# Patient Record
Sex: Female | Born: 1955 | Race: Black or African American | Hispanic: No | Marital: Married | Smoking: Former smoker
Health system: Southern US, Community
[De-identification: ages and names within clinical notes are randomized; demographics above are authoritative.]

## PROBLEM LIST (undated history)

## (undated) DIAGNOSIS — G4730 Sleep apnea, unspecified: Secondary | ICD-10-CM

## (undated) DIAGNOSIS — E119 Type 2 diabetes mellitus without complications: Secondary | ICD-10-CM

## (undated) HISTORY — PX: UTERINE FIBROID SURGERY: SHX826

---

## 2010-03-19 HISTORY — PX: PARTIAL HYSTERECTOMY: SHX80

## 2018-12-03 ENCOUNTER — Other Ambulatory Visit: Payer: Self-pay

## 2018-12-03 ENCOUNTER — Ambulatory Visit
Admission: EM | Admit: 2018-12-03 | Discharge: 2018-12-03 | Disposition: A | Discharge status: Home / Self Care | Payer: 59 | Attending: Physician Assistant | Admitting: Physician Assistant

## 2018-12-03 DIAGNOSIS — H6990 Unspecified Eustachian tube disorder, unspecified ear: Secondary | ICD-10-CM | POA: Diagnosis not present

## 2018-12-03 DIAGNOSIS — G8929 Other chronic pain: Secondary | ICD-10-CM

## 2018-12-03 DIAGNOSIS — S0300XA Dislocation of jaw, unspecified side, initial encounter: Secondary | ICD-10-CM

## 2018-12-03 DIAGNOSIS — H9203 Otalgia, bilateral: Secondary | ICD-10-CM | POA: Diagnosis not present

## 2018-12-03 HISTORY — DX: Type 2 diabetes mellitus without complications: E11.9

## 2018-12-03 MED ORDER — AZELASTINE HCL 0.1 % NA SOLN: 2.0000 | Freq: Two times a day (BID) | NASAL | 0 refills | Status: DC

## 2018-12-03 MED ORDER — MELOXICAM 7.5 MG PO TABS: 7.5000 mg | ORAL_TABLET | Freq: Every day | ORAL | 0 refills | Status: DC

## 2018-12-03 NOTE — Discharge Instructions (Addendum)
You can start over the counter flonase/nasacort nasal spray for ear pain for 1-2 weeks. If not improving, can fill azelastine for possible eustachian tube dysfunction. Start Mobic. Do not take ibuprofen (motrin/advil)/ naproxen (aleve) while on mobic. This will help decrease inflammation for the jaw to see if this is the cause of ear pain. Follow up with ENT if symptoms not improving.

## 2018-12-03 NOTE — ED Triage Notes (Signed)
Pt presents to UC w/ c/o bilateral ear pain x2-3 months. Pt denies drainage. Pt reports being seen last year December for the same thing and have done MRIs. Pt reports taking tylenol 1000mg  that helps with pain but it keeps coming back.

## 2018-12-03 NOTE — ED Provider Notes (Signed)
[name]    CSN: [id number] Arrival date & time: 12/03/18  1159      History   Chief Complaint Chief Complaint  Patient presents with  . bilateral ear pain    HPI Michaela GildingLinda Alvarado is a 63 y.o. female.   63 year old female comes in with 2-3 month history of bilateral ear pain. States she has history of ear pain, and was scheduled to have MRI done. However, she [place] prior to this. She would like to make sure she didn't have an infection and therefore came in for evaluation.  Pain is intermittent, bilateral, and can have pain to the neck, head due to ear pain.  Pain is usually worse first thing in the morning, that relieved throughout the day.  Tylenol with some improvement.  She denies muffled hearing, ear drainage.  Denies URI symptoms such as cough, congestion, sore throat.  Denies fever, chills, night sweats.     Past Medical History:  Diagnosis Date  . Diabetes mellitus without complication (HCC)     There are no active problems to display for this patient.   Past Surgical History:  Procedure Laterality Date  . UTERINE FIBROID SURGERY      OB History   No obstetric history on file.      Home Medications    Prior to Admission medications   Medication Sig Start Date End Date Taking? Authorizing Provider  azelastine (ASTELIN) 0.1 % nasal spray Place 2 sprays into both nostrils 2 (two) times daily. Use in each nostril as directed 12/03/18   Cathie HoopsYu, Amy V, PA-C  meloxicam (MOBIC) 7.5 MG tablet Take 1 tablet (7.5 mg total) by mouth daily. 12/03/18   Belinda FisherYu, Amy V, PA-C    Family History Family History  Problem Relation Age of Onset  . Diabetes Mother   . Hypertension Mother   . Leukemia Father     Social History Social History   Tobacco Use  . Smoking status: Former Games developermoker  . Smokeless tobacco: Never Used  Substance Use Topics  . Alcohol use: Never    Frequency: Never  . Drug use: Never     Allergies   Flagyl  [metronidazole] and Peach [prunus persica]   Review of Systems Review of Systems  Reason unable to perform ROS: See HPI as above.     Physical Exam Triage Vital Signs ED Triage Vitals  Enc Vitals Group     BP 12/03/18 1215 (!) 156/80     Pulse Rate 12/03/18 1215 81     Resp 12/03/18 1215 16     Temp 12/03/18 1215 98.2 F (36.8 C)     Temp Source 12/03/18 1215 Oral     SpO2 12/03/18 1215 96 %     Weight --      Height --      Head Circumference --      Peak Flow --      Pain Score 12/03/18 1220 6     Pain Loc --      Pain Edu? --      Excl. in [place]? --    No data found.  Updated Vital Signs BP (!) 156/80 (BP Location: Left Arm)   Pulse 81   Temp 98.2 F (36.8 C) (Oral)   Resp 16   SpO2 96%   Physical Exam Constitutional:      General: She is not in acute distress.    Appearance: Normal appearance. She is not ill-appearing,  toxic-appearing or diaphoretic.  HENT:     Head: Normocephalic and atraumatic.     Comments: Tenderness to TMJ with crepitus. No swelling, trismus.     Ears:     Comments: No tenderness to palpation of tragus and mastoid. No swelling of the ear canal. TM visible, no erythema, bulging, mid ear effusion.     Mouth/Throat:     Mouth: Mucous membranes are moist.     Pharynx: Oropharynx is clear. Uvula midline.  Neck:     Musculoskeletal: Normal range of motion and neck supple. No spinous process tenderness or muscular tenderness.  Cardiovascular:     Rate and Rhythm: Normal rate and regular rhythm.     Heart sounds: Normal heart sounds. No murmur. No friction rub. No gallop.   Pulmonary:     Effort: Pulmonary effort is normal. No accessory muscle usage, prolonged expiration, respiratory distress or retractions.     Comments: Lungs clear to auscultation without adventitious lung sounds. Neurological:     General: No focal deficit present.     Mental Status: She is alert and oriented to person, place, and time.      UC Treatments / Results   Labs (all labs ordered are listed, but only abnormal results are displayed) Labs Reviewed - No data to display  EKG   Radiology No results found.  Procedures Procedures (including critical care time)  Medications Ordered in UC Medications - No data to display  Initial Impression / Assessment and Plan / UC Course  I have reviewed the triage vital signs and the nursing notes.  Pertinent labs & imaging results that were available during my care of the patient were reviewed by me and considered in my medical decision making (see chart for details).    ? Possible TMD causing ear/neck pain. Start mobic as directed. Will have patient start flonase/nasacort to cover for eustachian tube dysfunction. Can switch to azelastine if needed. Patient to follow up with ENT for further evaluation if symptoms not improving.   Final Clinical Impressions(s) / UC Diagnoses   Final diagnoses:  Chronic ear pain, bilateral  TMJ (dislocation of temporomandibular joint), initial encounter   ED Prescriptions    Medication Sig Dispense Auth. Provider   azelastine (ASTELIN) 0.1 % nasal spray Place 2 sprays into both nostrils 2 (two) times daily. Use in each nostril as directed 30 mL Yu, Amy V, PA-C   meloxicam (MOBIC) 7.5 MG tablet Take 1 tablet (7.5 mg total) by mouth daily. 15 tablet Tobin Chad, [place] 12/03/18 1445

## 2018-12-04 ENCOUNTER — Telehealth: Payer: Self-pay

## 2019-01-23 ENCOUNTER — Other Ambulatory Visit: Payer: Self-pay | Admitting: Otolaryngology

## 2019-01-23 DIAGNOSIS — M899 Disorder of bone, unspecified: Secondary | ICD-10-CM

## 2019-01-23 DIAGNOSIS — H9203 Otalgia, bilateral: Secondary | ICD-10-CM

## 2019-01-23 DIAGNOSIS — E079 Disorder of thyroid, unspecified: Secondary | ICD-10-CM

## 2019-01-23 DIAGNOSIS — H918X1 Other specified hearing loss, right ear: Secondary | ICD-10-CM

## 2019-01-23 DIAGNOSIS — IMO0001 Reserved for inherently not codable concepts without codable children: Secondary | ICD-10-CM

## 2019-01-30 ENCOUNTER — Ambulatory Visit
Admission: RE | Admit: 2019-01-30 | Discharge: 2019-01-30 | Disposition: A | Discharge status: Home / Self Care | Payer: 59 | Source: Ambulatory Visit | Attending: Otolaryngology | Admitting: Otolaryngology

## 2019-01-30 DIAGNOSIS — E079 Disorder of thyroid, unspecified: Secondary | ICD-10-CM

## 2019-02-05 ENCOUNTER — Other Ambulatory Visit: Payer: Self-pay | Admitting: Otolaryngology

## 2019-02-05 DIAGNOSIS — E041 Nontoxic single thyroid nodule: Secondary | ICD-10-CM

## 2019-02-15 ENCOUNTER — Other Ambulatory Visit: Payer: Self-pay

## 2019-02-15 ENCOUNTER — Ambulatory Visit
Admission: RE | Admit: 2019-02-15 | Discharge: 2019-02-15 | Disposition: A | Discharge status: Home / Self Care | Payer: 59 | Source: Ambulatory Visit | Attending: Otolaryngology | Admitting: Otolaryngology

## 2019-02-15 DIAGNOSIS — M899 Disorder of bone, unspecified: Secondary | ICD-10-CM

## 2019-02-15 DIAGNOSIS — H918X1 Other specified hearing loss, right ear: Secondary | ICD-10-CM

## 2019-02-15 DIAGNOSIS — H9203 Otalgia, bilateral: Secondary | ICD-10-CM

## 2019-02-15 DIAGNOSIS — IMO0001 Reserved for inherently not codable concepts without codable children: Secondary | ICD-10-CM

## 2019-02-15 MED ORDER — GADOBENATE DIMEGLUMINE 529 MG/ML IV SOLN
20.0000 mL | Freq: Once | INTRAVENOUS | Status: AC | PRN
  Administered 2019-02-15: 20 mL via INTRAVENOUS

## 2019-02-25 ENCOUNTER — Inpatient Hospital Stay (HOSPITAL_COMMUNITY): Admit: 2019-02-25 | Payer: 59

## 2019-02-25 ENCOUNTER — Ambulatory Visit
Admission: RE | Admit: 2019-02-25 | Discharge: 2019-02-25 | Disposition: A | Discharge status: Home / Self Care | Payer: 59 | Source: Ambulatory Visit | Attending: Otolaryngology | Admitting: Otolaryngology

## 2019-02-25 ENCOUNTER — Other Ambulatory Visit (HOSPITAL_COMMUNITY)
Admission: RE | Admit: 2019-02-25 | Discharge: 2019-02-25 | Disposition: A | Discharge status: Home / Self Care | Payer: 59 | Source: Ambulatory Visit | Attending: Otolaryngology | Admitting: Otolaryngology

## 2019-02-25 DIAGNOSIS — E041 Nontoxic single thyroid nodule: Secondary | ICD-10-CM | POA: Insufficient documentation

## 2019-02-25 NOTE — Procedures (Signed)
PROCEDURE SUMMARY:  Using direct ultrasound guidance, 5 passes were made using 25 g needles into the nodule within the right inferior lobe of the thyroid and  5 passes were made using 25 g needles into the nodule within the left inferior lobe of the thyroid.   Ultrasound was used to confirm needle placements on all occasions.   EBL = trace  Specimens were sent to Pathology for analysis.  See procedure note under Imaging tab in Epic for full procedure details.  Other Atienza S Jonnie Truxillo PA-C 02/25/2019 4:08 PM

## 2019-02-26 LAB — CYTOLOGY - NON PAP

## 2019-07-22 ENCOUNTER — Ambulatory Visit
Admission: EM | Admit: 2019-07-22 | Discharge: 2019-07-22 | Disposition: A | Discharge status: Home / Self Care | Payer: 59 | Attending: Physician Assistant | Admitting: Physician Assistant

## 2019-07-22 DIAGNOSIS — R002 Palpitations: Secondary | ICD-10-CM

## 2019-07-22 DIAGNOSIS — K219 Gastro-esophageal reflux disease without esophagitis: Secondary | ICD-10-CM

## 2019-07-22 LAB — POCT FASTING CBG KUC MANUAL ENTRY: POCT Glucose (KUC): 108 mg/dL — AB (ref 70–99)

## 2019-07-22 NOTE — ED Triage Notes (Signed)
Pt states dx with pre diabetes with no meds last year with an A1C 6.1. states hasn't felt good at night for the past few weeks having frequent urination and pain to lt foot. States also has neck pain and palpitations at night. Pt denies any problems at this time.

## 2019-07-22 NOTE — ED Provider Notes (Signed)
[name]    CSN: [id number] Arrival date & time: 07/22/19  1009      History   Chief Complaint Chief Complaint  Patient presents with  . Hyperglycemia    HPI Michaela Alvarado is a 64 y.o. female.   64 year old female comes in for worries of hyperglycemia.  Patient states has a history of prediabetes, and had A1c of 6.1 last year.  Her current PCP appointment is scheduled for August 2021.  Recently, started feeling neck pain and palpitations at night, and worries for uncontrolled diabetes. States has GERD at baseline, unknown if worsening. Denies palpitations during the day, chest pain, shortness of breath. Denies weakness, dizziness, syncope. Denies exertional fatigue, exertional chest pain, dyspnea on exertion. History of thyroid nodule without abnormal TSH. She is currently asymptomatic.      Past Medical History:  Diagnosis Date  . Diabetes mellitus without complication ([place])     There are no problems to display for this patient.   Past Surgical History:  Procedure Laterality Date  . UTERINE FIBROID SURGERY      OB History   No obstetric history on file.      Home Medications    Prior to Admission medications   Not on File    Family History Family History  Problem Relation Age of Onset  . Diabetes Mother   . Hypertension Mother   . Leukemia Father     Social History Social History   Tobacco Use  . Smoking status: Former Research scientist (life sciences)  . Smokeless tobacco: Never Used  Substance Use Topics  . Alcohol use: Never  . Drug use: Never     Allergies   Flagyl [metronidazole] and Peach [prunus persica]   Review of Systems Review of Systems  Reason unable to perform ROS: See HPI as above.     Physical Exam Triage Vital Signs ED Triage Vitals  Enc Vitals Group     BP 07/22/19 1017 134/81     Pulse Rate 07/22/19 1017 87     Resp 07/22/19 1017 18     Temp 07/22/19 1017 98.3 F (36.8 C)     Temp Source 07/22/19 1017 Oral   SpO2 07/22/19 1017 95 %     Weight --      Height --      Head Circumference --      Peak Flow --      Pain Score 07/22/19 1025 5     Pain Loc --      Pain Edu? --      Excl. in [place]? --    No data found.  Updated Vital Signs BP 134/81 (BP Location: Right Arm)   Pulse 87   Temp 98.3 F (36.8 C) (Oral)   Resp 18   SpO2 95%   Physical Exam Constitutional:      General: She is not in acute distress.    Appearance: Normal appearance. She is well-developed. She is not toxic-appearing or diaphoretic.  HENT:     Head: Normocephalic and atraumatic.  Eyes:     Conjunctiva/sclera: Conjunctivae normal.     Pupils: Pupils are equal, round, and reactive to light.  Cardiovascular:     Rate and Rhythm: Normal rate and regular rhythm.  Pulmonary:     Effort: Pulmonary effort is normal. No respiratory distress.     Comments: Speaking in full sentences without difficulty. LCTAB Musculoskeletal:     Cervical back: Normal range of motion and neck supple.  Skin:  General: Skin is warm and dry.  Neurological:     Mental Status: She is alert and oriented to person, place, and time.      UC Treatments / Results  Labs (all labs ordered are listed, but only abnormal results are displayed) Labs Reviewed  POCT FASTING CBG [facility] MANUAL ENTRY - Abnormal; Notable for the following components:      Result Value   POCT Glucose ([facility]) 108 (*)    All other components within normal limits    EKG   Radiology No results found.  Procedures Procedures (including critical care time)  Medications Ordered in UC Medications - No data to display  Initial Impression / Assessment and Plan / UC Course  I have reviewed the triage vital signs and the nursing notes.  Pertinent labs & imaging results that were available during my care of the patient were reviewed by me and considered in my medical decision making (see chart for details).    Fasting CBG 108. EKG NSR, 74bpm, no acute ST changes, no  prior EKG for comparison.  Patient asymptomatic at this time. No exertional symptoms, low suspicion for ACS. Can try otc H2 blocker/PPI for GERD. Otherwise, to follow up with PCP as scheduled for further evaluation. Return precautions given. Patient expresses understanding and agrees to plan.  Final Clinical Impressions(s) / UC Diagnoses   Final diagnoses:  Palpitations  Gastroesophageal reflux disease without esophagitis   ED Prescriptions    None     PDMP not reviewed this encounter.   Belinda Fisher, PA-C 07/22/19 1257

## 2019-07-22 NOTE — Discharge Instructions (Addendum)
No alarming signs on exam. You glucose was 108 today. You can try over the counter pepcid 20mg  daily for the next 7-14 days to see if acid reflux is causing your symptoms. Keep hydrated, urine should be clear to pale yellow in color. Watch your carbohydrate intake. Otherwise, follow up with PCP as scheduled for further evaluation and management needed. If having chest pain, shortness of breath, passing out, go to the ED for further evaluation.   BP 134/81, temp 98.3, HR 87, RR 18, O2 95%

## 2019-12-03 ENCOUNTER — Other Ambulatory Visit: Payer: Self-pay | Admitting: Family Medicine

## 2019-12-03 DIAGNOSIS — Z1231 Encounter for screening mammogram for malignant neoplasm of breast: Secondary | ICD-10-CM

## 2019-12-07 ENCOUNTER — Ambulatory Visit: Payer: 59

## 2020-01-01 ENCOUNTER — Ambulatory Visit
Admission: RE | Admit: 2020-01-01 | Discharge: 2020-01-01 | Disposition: A | Discharge status: Home / Self Care | Payer: 59 | Source: Ambulatory Visit | Attending: Family Medicine | Admitting: Family Medicine

## 2020-01-01 ENCOUNTER — Other Ambulatory Visit: Payer: Self-pay

## 2020-01-01 DIAGNOSIS — Z1231 Encounter for screening mammogram for malignant neoplasm of breast: Secondary | ICD-10-CM

## 2020-05-25 ENCOUNTER — Other Ambulatory Visit: Payer: Self-pay

## 2020-05-25 ENCOUNTER — Ambulatory Visit
Admission: EM | Admit: 2020-05-25 | Discharge: 2020-05-25 | Disposition: A | Discharge status: Home / Self Care | Payer: 59 | Attending: Emergency Medicine | Admitting: Emergency Medicine

## 2020-05-25 DIAGNOSIS — R42 Dizziness and giddiness: Secondary | ICD-10-CM | POA: Diagnosis not present

## 2020-05-25 DIAGNOSIS — R519 Headache, unspecified: Secondary | ICD-10-CM

## 2020-05-25 LAB — POCT FASTING CBG KUC MANUAL ENTRY: POCT Glucose (KUC): 109 mg/dL — AB (ref 70–99)

## 2020-05-25 MED ORDER — ONDANSETRON 4 MG PO TBDP: 4.0000 mg | ORAL_TABLET | Freq: Three times a day (TID) | ORAL | 0 refills | Status: DC | PRN

## 2020-05-25 MED ORDER — NAPROXEN 500 MG PO TABS: 500.0000 mg | ORAL_TABLET | Freq: Two times a day (BID) | ORAL | 0 refills | Status: DC

## 2020-05-25 MED ORDER — TIZANIDINE HCL 4 MG PO TABS: 4.0000 mg | ORAL_TABLET | Freq: Four times a day (QID) | ORAL | 0 refills | Status: DC | PRN

## 2020-05-25 MED ORDER — KETOROLAC TROMETHAMINE 30 MG/ML IJ SOLN
30.0000 mg | Freq: Once | INTRAMUSCULAR | Status: AC
  Administered 2020-05-25: 30 mg via INTRAMUSCULAR

## 2020-05-25 MED ORDER — ONDANSETRON 4 MG PO TBDP
4.0000 mg | ORAL_TABLET | Freq: Once | ORAL | Status: AC
  Administered 2020-05-25: 4 mg via ORAL

## 2020-05-25 NOTE — ED Triage Notes (Signed)
Pt c/o lightheadedness and headache x3 days. States hx of headaches and has an appt with neurologist on 04/05.

## 2020-05-25 NOTE — Discharge Instructions (Addendum)
Blood sugar today- 109 We gave you a shot of Toradol Continue with Naprosyn twice daily at home as needed for headaches with food Zofran dissolved in mouth for nausea Supplement tizanidine at home/bedtime for neck pain, this is a muscle relaxer, may cause drowsiness, start with half tablet increase to 1 tablet if tolerating  Follow-up with neurology as planned If headache/lightheadedness progressing or worsening please go to the emergency room

## 2020-05-25 NOTE — ED Provider Notes (Signed)
[name]    CSN: [id number] Arrival date & time: 05/25/20  4270      History   Chief Complaint Chief Complaint  Patient presents with  . Dizziness  . Headache    HPI Michaela Alvarado is a 65 y.o. female history of DM type II presenting today for evaluation of lightheadedness and headache.  Patient reports that she has history of headaches and has plans to follow-up with neurology in approximately 1 month.  Reports that she has had intermittent headaches for approximately 5 years.  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  Had neurology visit approximately 1 year ago and was believed that this area was unlikely malignancy, but recommended to follow-up.  She has plans to see neurology in approximately 1 month.  Over the past 3 days she has continued to have headaches as well as associated lightheadedness which she describes as slightly feeling off balance.  Sensations are intermittent and will come in waves.  Notices symptoms worse with lying flat.  Denies spinning sensation.  Denies recent URI symptoms, cough congestion or sore throat.  Denies fevers.  Does report some mild neck soreness and discomfort, more prominent on the right side.  She denies any chest pain or shortness of breath.  Denies sensations of presyncope.  Typically will use Tylenol or Excedrin for headaches.  Denies any weakness.  HPI  Past Medical History:  Diagnosis Date  . Diabetes mellitus without complication (HCC)     There are no problems to display for this patient.   Past Surgical History:  Procedure Laterality Date  . UTERINE FIBROID SURGERY      OB History   No obstetric history on file.      Home Medications    Prior to Admission medications   Medication Sig Start Date End Date Taking? Authorizing Provider  naproxen (NAPROSYN) 500 MG tablet Take 1  tablet (500 mg total) by mouth 2 (two) times daily. 05/25/20  Yes Dorene Bruni C, PA-C  ondansetron (ZOFRAN ODT) 4 MG disintegrating tablet Take 1 tablet (4 mg total) by mouth every 8 (eight) hours as needed for nausea or vomiting. 05/25/20  Yes Shloma Roggenkamp C, PA-C  tiZANidine (ZANAFLEX) 4 MG tablet Take 1 tablet (4 mg total) by mouth every 6 (six) hours as needed for muscle spasms. 05/25/20  Yes Kesean Serviss, Junius Creamer, PA-C    Family History Family History  Problem Relation Age of Onset  . Diabetes Mother   . Hypertension Mother   . Leukemia Father     Social History Social History   Tobacco Use  . Smoking status: Former Games developer  . Smokeless tobacco: Never Used  Substance Use Topics  . Alcohol use: Never  . Drug use: Never     Allergies   Flagyl [metronidazole] and Peach [prunus persica]   Review of Systems Review of Systems  Constitutional: Negative for fatigue and fever.  HENT: Negative for congestion, sinus pressure and sore throat.   Eyes: Negative for photophobia, pain and visual disturbance.  Respiratory: Negative for cough and shortness of breath.   Cardiovascular: Negative for chest pain.  Gastrointestinal: Negative for abdominal pain, nausea and vomiting.  Genitourinary: Negative for decreased urine volume and hematuria.  Musculoskeletal: Negative for myalgias, neck pain and neck stiffness.  Neurological: Positive for dizziness, light-headedness and headaches. Negative for syncope, facial  asymmetry, speech difficulty, weakness and numbness.     Physical Exam Triage Vital Signs ED Triage Vitals  Enc Vitals Group     BP      Pulse      Resp      Temp      Temp src      SpO2      Weight      Height      Head Circumference      Peak Flow      Pain Score      Pain Loc      Pain Edu?      Excl. in [place]?    No data found.  Updated Vital Signs BP (!) 156/102 (BP Location: Left Arm)   Pulse 97   Temp 97.9 F (36.6 C) (Oral)   Resp 18   SpO2 94%    Visual Acuity Right Eye Distance:   Left Eye Distance:   Bilateral Distance:    Right Eye Near:   Left Eye Near:    Bilateral Near:     Physical Exam Vitals and nursing note reviewed.  Constitutional:      Appearance: She is well-developed and well-nourished.     Comments: No acute distress  HENT:     Head: Normocephalic and atraumatic.     Ears:     Comments: Bilateral ears without tenderness to palpation of external auricle, tragus and mastoid, EAC's without erythema or swelling, TM's with good bony landmarks and cone of light. Non erythematous.     Nose: Nose normal.     Mouth/Throat:     Comments: Oral mucosa pink and moist, no tonsillar enlargement or exudate. Posterior pharynx patent and nonerythematous, no uvula deviation or swelling. Normal phonation. Eyes:     Extraocular Movements: Extraocular movements intact.     Conjunctiva/sclera: Conjunctivae normal.     Pupils: Pupils are equal, round, and reactive to light.  Cardiovascular:     Rate and Rhythm: Normal rate and regular rhythm.  Pulmonary:     Effort: Pulmonary effort is normal. No respiratory distress.     Comments: Breathing comfortably at rest, CTABL, no wheezing, rales or other adventitious sounds auscultated Abdominal:     General: There is no distension.     Palpations: Abdomen is soft.     Tenderness: There is no abdominal tenderness.  Musculoskeletal:        General: Normal range of motion.     Cervical back: Neck supple.  Skin:    General: Skin is warm and dry.  Neurological:     Mental Status: She is alert and oriented to person, place, and time.     Comments: Patient A&O x3, cranial nerves II-XII grossly intact, strength at shoulders, hips and knees 5/5, equal bilaterally, Negative Romberg and Pronator Drift. Gait without abnormality.  Psychiatric:        Mood and Affect: Mood and affect normal.      UC Treatments / Results  Labs (all labs ordered are listed, but only abnormal results  are displayed) Labs Reviewed  POCT FASTING CBG [facility] MANUAL ENTRY - Abnormal; Notable for the following components:      Result Value   POCT Glucose ([facility]) 109 (*)    All other components within normal limits  CBC WITH DIFFERENTIAL/PLATELET  COMPREHENSIVE METABOLIC PANEL    EKG   Radiology No results found.  Procedures Procedures (including critical care time)  Medications Ordered in UC Medications  ketorolac (TORADOL) 30  MG/ML injection 30 mg (30 mg Intramuscular Given 05/25/20 1030)  ondansetron (ZOFRAN-ODT) disintegrating tablet 4 mg (4 mg Oral Given 05/25/20 1031)    Initial Impression / Assessment and Plan / UC Course  I have reviewed the triage vital signs and the nursing notes.  Pertinent labs & imaging results that were available during my care of the patient were reviewed by me and considered in my medical decision making (see chart for details).   Blood sugar today- 109  No neuro deficits in clinic today, no red flags in regards to headache although pattern is changing encouraged to continue follow-up with neurology as planned.  In the meantime we will treat headache today with Toradol and Zofran prior to discharge and continuing treatment at home with Naprosyn and tizanidine and Zofran.  Advised to go to emergency room if symptoms progressing or worsening in the interim between now and follow-up with neurology.  Given the lightheadedness also check basic labs of CMP and CBC.  Will call with results if abnormal and alter therapy as needed.  Continue to monitor BP.  Discussed strict return precautions. Patient verbalized understanding and is agreeable with plan.  Final Clinical Impressions(s) / UC Diagnoses   Final diagnoses:  Lightheadedness  Acute nonintractable headache, unspecified headache type     Discharge Instructions     Blood sugar today- 109 We gave you a shot of Toradol Continue with Naprosyn twice daily at home as needed for headaches with food Zofran  dissolved in mouth for nausea Supplement tizanidine at home/bedtime for neck pain, this is a muscle relaxer, may cause drowsiness, start with half tablet increase to 1 tablet if tolerating  Follow-up with neurology as planned If headache/lightheadedness progressing or worsening please go to the emergency room    ED Prescriptions    Medication Sig Dispense Auth. Provider   naproxen (NAPROSYN) 500 MG tablet Take 1 tablet (500 mg total) by mouth 2 (two) times daily. 30 tablet Kharlie Bring C, PA-C   ondansetron (ZOFRAN ODT) 4 MG disintegrating tablet Take 1 tablet (4 mg total) by mouth every 8 (eight) hours as needed for nausea or vomiting. 20 tablet Embry Manrique C, PA-C   tiZANidine (ZANAFLEX) 4 MG tablet Take 1 tablet (4 mg total) by mouth every 6 (six) hours as needed for muscle spasms. 30 tablet Jaecion Dempster, [place] C, PA-C     PDMP not reviewed this encounter.   Maisyn Nouri, Maybee C, PA-C 05/25/20 1051

## 2020-05-26 LAB — CBC WITH DIFFERENTIAL/PLATELET
Basophils Absolute: 0 10*3/uL (ref 0.0–0.2)
Basos: 0 %
EOS (ABSOLUTE): 0.2 10*3/uL (ref 0.0–0.4)
Eos: 2 %
Hematocrit: 46.2 % (ref 34.0–46.6)
Hemoglobin: 15.1 g/dL (ref 11.1–15.9)
Immature Grans (Abs): 0 10*3/uL (ref 0.0–0.1)
Immature Granulocytes: 0 %
Lymphocytes Absolute: 2.4 10*3/uL (ref 0.7–3.1)
Lymphs: 25 %
MCH: 25.9 pg — ABNORMAL LOW (ref 26.6–33.0)
MCHC: 32.7 g/dL (ref 31.5–35.7)
MCV: 79 fL (ref 79–97)
Monocytes Absolute: 0.5 10*3/uL (ref 0.1–0.9)
Monocytes: 5 %
Neutrophils Absolute: 6.5 10*3/uL (ref 1.4–7.0)
Neutrophils: 68 %
Platelets: 350 10*3/uL (ref 150–450)
RBC: 5.84 x10E6/uL — ABNORMAL HIGH (ref 3.77–5.28)
RDW: 14.7 % (ref 11.7–15.4)
WBC: 9.6 10*3/uL (ref 3.4–10.8)

## 2020-05-26 LAB — COMPREHENSIVE METABOLIC PANEL
ALT: 18 IU/L (ref 0–32)
AST: 11 IU/L (ref 0–40)
Albumin/Globulin Ratio: 1.8 (ref 1.2–2.2)
Albumin: 4.6 g/dL (ref 3.8–4.8)
Alkaline Phosphatase: 122 IU/L — ABNORMAL HIGH (ref 44–121)
BUN/Creatinine Ratio: 17 (ref 12–28)
BUN: 15 mg/dL (ref 8–27)
Bilirubin Total: 0.4 mg/dL (ref 0.0–1.2)
CO2: 21 mmol/L (ref 20–29)
Calcium: 9.7 mg/dL (ref 8.7–10.3)
Chloride: 103 mmol/L (ref 96–106)
Creatinine, Ser: 0.86 mg/dL (ref 0.57–1.00)
Globulin, Total: 2.5 g/dL (ref 1.5–4.5)
Glucose: 103 mg/dL — ABNORMAL HIGH (ref 65–99)
Potassium: 4.1 mmol/L (ref 3.5–5.2)
Sodium: 144 mmol/L (ref 134–144)
Total Protein: 7.1 g/dL (ref 6.0–8.5)
eGFR: 76 mL/min/{1.73_m2} (ref >59)

## 2020-06-20 NOTE — Progress Notes (Signed)
NEUROLOGY CONSULTATION NOTE  Michaela Alvarado MRN: [id number] DOB: June 29, 1955  Referring provider: Blair Heys, MD Primary care provider: Blair Heys, MD  Reason for consult:  headache  Assessment/Plan:   1.  Migraine without aura, without status migrainosus, not intractable 2.  Anxiety 3.  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  Excessive daytime sleepiness Migraines likely related to anxiety.  Also given that she has morning headaches and some daytime fatigue, would like to evaluate for underlying OSA.  These headaches are not due to the bone lesion, which does not involve the brain.  1.  Order sleep study 2.  If OSA not detected, consider starting sertraline for anxiety and headache prevention 3.  Limit use of pain relievers to no more than 2 days out of week to prevent risk of rebound or medication-overuse headache. 4.  Keep headache diary 5.  Follow up after testing.   Subjective:  Michaela Alvarado is a 65 year old right-handed female with diabetes and nodular goiter biopsied in 02/2019 with negative pathology who presents for headache.  History supplemented by referring provider's note.   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  Follow up MRI on 11/13/2016 demonstrated stability of the bone lesions and interval increase size of presumed scalp sebaceous cyst.  MRI of brain with and without  contrast on 02/15/2019 personally reviewed showed 19 mm right parietal bone lesion with normal appearance of the brain.  Current NSAIDS/analgesics:  Tylenol, Naproxen 500mg  Current triptans:  none Current ergotamine:  none Current anti-emetic:  Zofran ODT 4mg  Current muscle relaxants:  Tizanidine 4mg  Q6h PRN Current Antihypertensive medications:  none Current Antidepressant medications:  none Current Anticonvulsant medications:  none Current anti-CGRP:  none Current Vitamins/Herbal/Supplements:  none Current Antihistamines/Decongestants:  none Other therapy:  none Hormone/birth control:  none  Past NSAIDS/analgesics:  none  Past abortive triptans:  none Past abortive ergotamine:  none Past muscle relaxants:  none Past anti-emetic:  none Past antihypertensive medications:  none Past antidepressant medications:  none Past anticonvulsant medications:  none Past anti-CGRP:  none Past vitamins/Herbal/Supplements:  none Past antihistamines/decongestants:  none Other past therapies:  none    05/10/2020 LABS: CMP with Na 147, K 4.8, Cl 109, CO2 29, Ca 9.47, glucose 92, BUN 17, Cr 0.94, t bili 0.3, ALP 100, AST 13, ALT 17  PAST MEDICAL HISTORY: Past Medical History:  Diagnosis Date  . Diabetes mellitus without complication (HCC)     PAST SURGICAL HISTORY: Past Surgical History:  Procedure Laterality Date  . UTERINE FIBROID SURGERY      MEDICATIONS: Current Outpatient Medications on File Prior to Visit  Medication Sig Dispense Refill  . naproxen (NAPROSYN) 500 MG tablet Take 1 tablet (500 mg total) by mouth 2 (two) times daily. 30 tablet 0  . ondansetron (ZOFRAN ODT) 4 MG disintegrating tablet Take 1 tablet (4 mg total) by mouth every 8 (eight)  hours as needed for nausea or vomiting. 20 tablet 0  . tiZANidine (ZANAFLEX) 4 MG tablet Take 1 tablet (4 mg total) by mouth every 6 (six) hours as needed for muscle spasms. 30 tablet 0   No current facility-administered medications on  file prior to visit.    ALLERGIES: Allergies  Allergen Reactions  . Flagyl [Metronidazole] Hives  . Peach [Prunus Persica] Hives    FAMILY HISTORY: Family History  Problem Relation Age of Onset  . Diabetes Mother   . Hypertension Mother   . Leukemia Father     Objective:  Blood pressure 139/87, pulse 84, weight 255 lb (115.7 kg), SpO2 96 %. General: No acute distress.  Patient appears well-groomed.   Head:  Normocephalic/atraumatic Eyes:  fundi examined but not visualized Neck: supple, no paraspinal tenderness, full range of motion Back: No paraspinal tenderness Heart: regular rate and rhythm Lungs: Clear to auscultation bilaterally. Vascular: No carotid bruits. Neurological Exam: Mental status: alert and oriented to person, place, and time, recent and remote memory intact, fund of knowledge intact, attention and concentration intact, speech fluent and not dysarthric, language intact. Cranial nerves: CN I: not tested CN II: pupils equal, round and reactive to light, visual fields intact CN III, IV, VI:  full range of motion, no nystagmus, no ptosis CN V: facial sensation intact. CN VII: upper and lower face symmetric CN VIII: hearing intact CN IX, X: gag intact, uvula midline CN XI: sternocleidomastoid and trapezius muscles intact CN XII: tongue midline Bulk & Tone: normal, no fasciculations. Motor:  muscle strength 5/5 throughout Sensation:  Pinprick, temperature and vibratory sensation intact. Deep Tendon Reflexes:  2+ throughout,  toes downgoing.   Finger to nose testing:  Without dysmetria.   Heel to shin:  Without dysmetria.   Gait:  Normal station and stride.  Romberg negative.    Thank you for allowing me to take part in the care of this patient.  Shon Millet, DO  CC: Blair Heys, MD

## 2020-06-21 ENCOUNTER — Other Ambulatory Visit: Payer: Self-pay

## 2020-06-21 ENCOUNTER — Encounter: Payer: Self-pay | Admitting: Neurology

## 2020-06-21 ENCOUNTER — Ambulatory Visit: Payer: 59 | Admitting: Neurology

## 2020-06-21 VITALS — BP 139/87 | HR 84 | Wt 255.0 lb

## 2020-06-21 DIAGNOSIS — R400 Somnolence: Secondary | ICD-10-CM | POA: Diagnosis not present

## 2020-06-21 DIAGNOSIS — F419 Anxiety disorder, unspecified: Secondary | ICD-10-CM

## 2020-06-21 DIAGNOSIS — R519 Headache, unspecified: Secondary | ICD-10-CM

## 2020-06-21 DIAGNOSIS — G43009 Migraine without aura, not intractable, without status migrainosus: Secondary | ICD-10-CM

## 2020-06-21 NOTE — Patient Instructions (Addendum)
  1. Check sleep study to evaluate for sleep apnea.  Following sleep study, we can start a medication for anxiety if needed (which may also help with headache) 2. Limit use of pain relievers to no more than 2 days out of the week.  These medications include acetaminophen, NSAIDs (ibuprofen/Advil/Motrin, naproxen/Aleve, triptans (Imitrex/sumatriptan), Excedrin, and narcotics.  This will help reduce risk of rebound headaches. 3. Be aware of common food triggers:  - Caffeine:  coffee, black tea, cola, Mt. Dew  - Chocolate  - Dairy:  aged cheeses (brie, blue, cheddar, gouda, [place], provolone, [employer], Swiss, etc), chocolate milk, buttermilk, sour cream, limit eggs and yogurt  - Nuts, peanut butter  - Alcohol  - Cereals/grains:  FRESH breads (fresh bagels, sourdough, doughnuts), yeast productions  - Processed/canned/aged/cured meats (pre-packaged deli meats, hotdogs)  - MSG/glutamate:  soy sauce, flavor enhancer, pickled/preserved/marinated foods  - Sweeteners:  aspartame (Equal, Nutrasweet).  Sugar and Splenda are okay  - Vegetables:  legumes (lima beans, lentils, snow peas, fava beans, pinto peans, peas, garbanzo beans), sauerkraut, onions, olives, pickles  - Fruit:  avocados, bananas, citrus fruit (orange, lemon, grapefruit), mango  - Other:  Frozen meals, macaroni and cheese 4. Routine exercise 5. Stay adequately hydrated (aim for 64 oz water daily) 6. Keep headache diary 7. Maintain proper stress management 8. Maintain proper sleep hygiene 9. Do not skip meals 10. Consider supplements:  magnesium citrate 400mg  daily, riboflavin 400mg  daily, coenzyme Q10 100mg  three times daily.

## 2020-06-23 ENCOUNTER — Telehealth: Payer: Self-pay

## 2020-06-23 NOTE — Telephone Encounter (Signed)
Patient is scheduled for home sleep study for May 16,2022. At [facility] long sleep disorder clinic, no authorization needed per referrals. Patient notified of this. located at [address] [place], [place] [phone]. At 12:00 noon

## 2020-08-01 ENCOUNTER — Other Ambulatory Visit: Payer: Self-pay

## 2020-08-01 ENCOUNTER — Ambulatory Visit (HOSPITAL_BASED_OUTPATIENT_CLINIC_OR_DEPARTMENT_OTHER): Payer: 59 | Attending: Neurology | Admitting: Internal Medicine

## 2020-08-01 VITALS — Ht 65.0 in | Wt 220.0 lb

## 2020-08-01 DIAGNOSIS — R400 Somnolence: Secondary | ICD-10-CM

## 2020-08-01 DIAGNOSIS — G43009 Migraine without aura, not intractable, without status migrainosus: Secondary | ICD-10-CM

## 2020-08-01 DIAGNOSIS — G4733 Obstructive sleep apnea (adult) (pediatric): Secondary | ICD-10-CM | POA: Insufficient documentation

## 2020-08-01 DIAGNOSIS — F419 Anxiety disorder, unspecified: Secondary | ICD-10-CM

## 2020-08-01 DIAGNOSIS — R519 Headache, unspecified: Secondary | ICD-10-CM

## 2020-08-06 DIAGNOSIS — G43009 Migraine without aura, not intractable, without status migrainosus: Secondary | ICD-10-CM

## 2020-08-06 NOTE — Procedures (Signed)
    Patient Name: Michaela Alvarado, Michaela Alvarado Date: 08/01/2020 Gender: Female D.O.B: Mar 12, 1956 Age (years): 43 Referring Provider: Drema Dallas Height (inches): 65 Interpreting Physician: Jetty Duhamel MD, ABSM Weight (lbs): 220 RPSGT: Barron Sink BMI: 37 MRN: [id number] Neck Size: 15.50  CLINICAL INFORMATION Sleep Study Type: HST Indication for sleep study: OSA Epworth Sleepiness Score: 1  SLEEP STUDY TECHNIQUE A multi-channel overnight portable sleep study was performed. The channels recorded were: nasal airflow, thoracic respiratory movement, and oxygen saturation with a pulse oximetry. Snoring was also monitored.  MEDICATIONS Patient self administered medications include: None reported.  SLEEP ARCHITECTURE Patient was studied for 393.2 minutes. The sleep efficiency was 100.0 % and the patient was supine for 99.2%. The arousal index was 0.0 per hour.  RESPIRATORY PARAMETERS The overall AHI was 31.9 per hour, with a central apnea index of 0 per hour. The oxygen nadir was 80% during sleep.  CARDIAC DATA Mean heart rate during sleep was 70.3 bpm.  IMPRESSIONS - Severe obstructive sleep apnea occurred during this study (AHI = 31.9/h). - Oxygen desaturation was noted during this study (Min O2 = 80%). Mean O2 saturation 94%. - Patient snored.  DIAGNOSIS - Obstructive Sleep Apnea (G47.33)  RECOMMENDATIONS - Suggest CPAP titration sleep study or autopap. Other options, including management consultation, a fitted oral appliance, or ENT evaluation, would be based on clinical judgment. - Be careful with alcohol, sedatives and other CNS depressants that may worsen sleep apnea and disrupt normal sleep architecture. - Sleep hygiene should be reviewed to assess factors that may improve sleep quality. - Weight management and regular exercise should be initiated or continued.  [Electronically signed] 08/06/2020 10:42 AM  Jetty Duhamel MD, ABSM Diplomate, American Board  of Sleep Medicine   NPI: [id number]                         Jetty Duhamel Diplomate, American Board of Sleep Medicine  ELECTRONICALLY SIGNED ON:  08/06/2020, 10:39 AM [facility] PH: [phone]   FX: [phone] ACCREDITED BY THE AMERICAN ACADEMY OF SLEEP MEDICINE

## 2020-08-12 NOTE — Progress Notes (Signed)
LMOVM to call the office back.

## 2020-08-18 ENCOUNTER — Other Ambulatory Visit: Payer: Self-pay

## 2020-08-18 DIAGNOSIS — R400 Somnolence: Secondary | ICD-10-CM

## 2020-08-18 DIAGNOSIS — R519 Headache, unspecified: Secondary | ICD-10-CM

## 2020-09-28 ENCOUNTER — Telehealth: Payer: Self-pay | Admitting: Neurology

## 2020-09-28 NOTE — Telephone Encounter (Signed)
Pt called in wanting to find out when she can get a Cpap machine? She is not sure if she would be getting it from [place] or not.

## 2020-09-28 NOTE — Telephone Encounter (Signed)
Telephone call to pt advised pt that she has an appt with  [facility] [address]  [place], [place]: [phone]  Fax: [phone]   There is a Sport and exercise psychologist of CPAp machines and it may take a while before she gets her machine. But when she goes to her appt with [facility] as again to be sure.

## 2020-10-04 ENCOUNTER — Encounter: Payer: Self-pay | Admitting: Pulmonary Disease

## 2020-10-04 ENCOUNTER — Ambulatory Visit (INDEPENDENT_AMBULATORY_CARE_PROVIDER_SITE_OTHER): Payer: 59 | Admitting: Pulmonary Disease

## 2020-10-04 ENCOUNTER — Other Ambulatory Visit: Payer: Self-pay

## 2020-10-04 VITALS — BP 116/72 | HR 85 | Temp 98.8°F | Ht 65.0 in | Wt 250.6 lb

## 2020-10-04 DIAGNOSIS — G4733 Obstructive sleep apnea (adult) (pediatric): Secondary | ICD-10-CM

## 2020-10-04 NOTE — Patient Instructions (Signed)
Will arrange for auto CPAP set up  Follow up in 4 months 

## 2020-10-04 NOTE — Progress Notes (Signed)
[name]  Chief Complaint  Patient presents with   Follow-up    Patient reports she has trouble staying asleep at night, but no other concerns,     Constitutional:  BP 116/72 (BP Location: Right Arm, Patient Position: Sitting, Cuff Size: Large)   Pulse 85   Temp 98.8 F (37.1 C) (Oral)   Ht 5\' 5"  (1.651 m)   Wt 250 lb 9.6 oz (113.7 kg)   SpO2 99%   BMI 41.70 kg/m   Past Medical History:  Pre-diabetes, Goiter, Headache  Past Surgical History:  She  has a past surgical history that includes Uterine fibroid surgery and Partial hysterectomy (N/A, 03/19/2010).  Brief Summary:  Michaela Alvarado is a 65 y.o. female with obstructive sleep apnea.      Subjective:   She recently moved from [place] after retiring.  She was seen by Dr. [place] for headaches.  Concern about her sleep.  She had sleep study in May that showed severe sleep apnea.  She snores and will stop breathing while asleep.  Wakes up hearing herself snore.  Can get sleepy during the day.  She goes to sleep at 11 pm.  She falls asleep 30 minutes.  She wakes up 1 or 2 times to use the bathroom.  She gets out of bed at 730 am.  She feels tired in the morning.  She denies morning headache.  She does uses melatonin at night.  Drinks coffee twice per day.  She denies sleep walking, sleep talking, bruxism, or nightmares.  There is no history of restless legs.  She denies sleep hallucinations, sleep paralysis, or cataplexy.  The Epworth score is 5 out of 24.   Physical Exam:   Appearance - well kempt   ENMT - no sinus tenderness, no oral exudate, no LAN, Mallampati 4 airway, no stridor  Respiratory - equal breath sounds bilaterally, no wheezing or rales  CV - s1s2 regular rate and rhythm, no murmurs  Ext - no clubbing, no edema  Skin - no rashes  Psych - normal mood and affect   Sleep Tests:  HST 08/01/20 >> AHI 31.9, SpO2 low 80%  Social History:   She  reports that she quit smoking about 25 years ago. Her smoking use included cigarettes. She started smoking about 36 years ago. She has a 2.50 pack-year smoking history. She has never used smokeless tobacco. She reports that she does not drink alcohol and does not use drugs.  Family History:  Her family history includes Diabetes in her mother; Hypertension in her mother; Leukemia in her father.     Assessment/Plan:   Obstructive sleep apnea. - reviewed her sleep study - discussed how sleep apnea can impact her health - driving precautions discussed - treatment options reviewed - will arrange for auto CPAP set up; she is okay with getting Resmed or Luna device  Obesity. - encouraged her to keep up with her weight loss efforts  Migraine headache. - followed by Dr. 08/03/20 with [facility]  Time Spent Involved in Patient Care on Day of Examination:  33 minutes  Follow up:   Patient Instructions  Will arrange for auto CPAP set up  Follow up in 4 months  Medication List:   Allergies as of 10/04/2020       Reactions   Flagyl [metronidazole] Hives   Peach [prunus Persica] Hives        Medication List  Accurate as of October 04, 2020  1:02 PM. If you have any questions, ask your nurse or doctor.          naproxen 500 MG tablet Commonly known as: NAPROSYN Take 1 tablet (500 mg total) by mouth 2 (two) times daily.   ondansetron 4 MG disintegrating tablet Commonly known as: Zofran ODT Take 1 tablet (4 mg total) by mouth every 8 (eight) hours as needed for nausea or vomiting.   tiZANidine 4 MG tablet Commonly known as: Zanaflex Take 1 tablet (4 mg total) by mouth every 6 (six) hours as needed for muscle spasms.        Signature:  Coralyn Helling, MD [facility]monary/Critical Care Pager - [phone] 10/04/2020, 1:02 PM

## 2020-11-25 ENCOUNTER — Other Ambulatory Visit: Payer: Self-pay | Admitting: Family Medicine

## 2020-11-25 DIAGNOSIS — Z1231 Encounter for screening mammogram for malignant neoplasm of breast: Secondary | ICD-10-CM

## 2021-01-02 ENCOUNTER — Other Ambulatory Visit: Payer: Self-pay

## 2021-01-02 ENCOUNTER — Ambulatory Visit
Admission: RE | Admit: 2021-01-02 | Discharge: 2021-01-02 | Disposition: A | Discharge status: Home / Self Care | Payer: 59 | Source: Ambulatory Visit | Attending: Family Medicine | Admitting: Family Medicine

## 2021-01-02 DIAGNOSIS — Z1231 Encounter for screening mammogram for malignant neoplasm of breast: Secondary | ICD-10-CM

## 2021-01-06 ENCOUNTER — Telehealth: Payer: Self-pay | Admitting: Pulmonary Disease

## 2021-01-06 NOTE — Telephone Encounter (Signed)
Called [facility] & spoke to [place].  He states pt called in today and she has appt in [place] office tomorrow at 3:00.  Nothing further needed.

## 2021-01-11 ENCOUNTER — Telehealth: Payer: Self-pay | Admitting: Pulmonary Disease

## 2021-01-11 NOTE — Telephone Encounter (Signed)
Called and spoke with patient. She stated that she had an appt with [facility] in [facility] on 01/07/21 at 3pm to receive her cpap machine. She went to the [place] location and found that the location was locked with no one inside. She attempted to call the number that she had on her phone but no one answered.   She has not received a call to get rescheduled. I advised her that I would check on this for her. She verbalized understanding.   Community message has been to the [employer] team. Will update once I have a response.

## 2021-01-23 ENCOUNTER — Ambulatory Visit (INDEPENDENT_AMBULATORY_CARE_PROVIDER_SITE_OTHER): Payer: 59

## 2021-01-23 ENCOUNTER — Ambulatory Visit
Admission: RE | Admit: 2021-01-23 | Discharge: 2021-01-23 | Disposition: A | Discharge status: Home / Self Care | Payer: 59 | Source: Ambulatory Visit | Attending: Physician Assistant | Admitting: Physician Assistant

## 2021-01-23 ENCOUNTER — Other Ambulatory Visit: Payer: Self-pay

## 2021-01-23 VITALS — BP 134/77 | HR 84 | Temp 98.1°F | Resp 18

## 2021-01-23 DIAGNOSIS — M25571 Pain in right ankle and joints of right foot: Secondary | ICD-10-CM

## 2021-01-23 MED ORDER — PREDNISONE 20 MG PO TABS: 40.0000 mg | ORAL_TABLET | Freq: Every day | ORAL | 0 refills | Status: AC

## 2021-01-23 NOTE — ED Triage Notes (Signed)
Pt c/o rt foot pain and swelling for a week. Denies injury. States worse after walking around.

## 2021-01-23 NOTE — ED Provider Notes (Signed)
[name]    CSN: [id number] Arrival date & time: 01/23/21  0847      History   Chief Complaint Chief Complaint  Patient presents with   appt 9 - foot pain    HPI Michaela Alvarado is a 65 y.o. female.   Patient here today for evaluation of right medial ankle pain that started about a week ago. She reports walking makes pain worse. She does not recall any known injury but does note she has to climb 16 stairs to get to her bedroom and is not sure if she accidentally twisted her ankle at some point. She reports swelling of her ankle as the day progresses. She denies any numbness. She has tried taking tylenol with mild relief.   The history is provided by the patient.   Past Medical History:  Diagnosis Date   Diabetes mellitus without complication (HCC)     There are no problems to display for this patient.   Past Surgical History:  Procedure Laterality Date   PARTIAL HYSTERECTOMY N/A 03/19/2010   UTERINE FIBROID SURGERY      OB History   No obstetric history on file.      Home Medications    Prior to Admission medications   Medication Sig Start Date End Date Taking? Authorizing Provider  predniSONE (DELTASONE) 20 MG tablet Take 2 tablets (40 mg total) by mouth daily with breakfast for 5 days. 01/23/21 01/28/21 Yes Tomi Bamberger, PA-C    Family History Family History  Problem Relation Age of Onset   Diabetes Mother    Hypertension Mother    Leukemia Father     Social History Social History   Tobacco Use   Smoking status: Former    Packs/day: 0.25    Years: 10.00    Pack years: 2.50    Types: Cigarettes    Start date: 03/19/1984    Quit date: 03/20/1995    Years since quitting: 25.8   Smokeless tobacco: Never  Substance Use Topics   Alcohol use: Never   Drug use: Never     Allergies   Flagyl [metronidazole] and Peach [prunus persica]   Review of Systems Review of Systems  Constitutional:  Negative for chills and  fever.  Eyes:  Negative for discharge and redness.  Respiratory:  Negative for shortness of breath.   Gastrointestinal:  Negative for nausea and vomiting.  Musculoskeletal:  Positive for arthralgias and joint swelling.  Skin:  Negative for color change.  Neurological:  Negative for numbness.    Physical Exam Triage Vital Signs ED Triage Vitals [01/23/21 0907]  Enc Vitals Group     BP 134/77     Pulse Rate 84     Resp 18     Temp 98.1 F (36.7 C)     Temp Source Oral     SpO2 95 %     Weight      Height      Head Circumference      Peak Flow      Pain Score 7     Pain Loc      Pain Edu?      Excl. in GC?    No data found.  Updated Vital Signs BP 134/77 (BP Location: Left Arm)   Pulse 84   Temp 98.1 F (36.7 C) (Oral)   Resp 18   SpO2 95%   Physical Exam Vitals and nursing note reviewed.  Constitutional:      General: She  is not in acute distress.    Appearance: Normal appearance. She is not ill-appearing.  HENT:     Head: Normocephalic and atraumatic.  Eyes:     Conjunctiva/sclera: Conjunctivae normal.  Cardiovascular:     Rate and Rhythm: Normal rate.  Pulmonary:     Effort: Pulmonary effort is normal.  Musculoskeletal:     Comments: Normal ROM of right ankle. Mild swelling to distal right medial ankle, mild TTP to same. No bruising or discoloration noted  Neurological:     Mental Status: She is alert.  Psychiatric:        Mood and Affect: Mood normal.        Behavior: Behavior normal.        Thought Content: Thought content normal.     UC Treatments / Results  Labs (all labs ordered are listed, but only abnormal results are displayed) Labs Reviewed - No data to display  EKG   Radiology DG Ankle Complete Right  Result Date: 01/23/2021 CLINICAL DATA:  Pain and swelling medially for 1 week radiating to RIGHT foot, denies injury, increased symptoms after walking around EXAM: RIGHT ANKLE - COMPLETE 3+ VIEW COMPARISON:  None FINDINGS: Soft tissue  swelling greatest medially. Osseous mineralization low normal. Joint spaces preserved. No acute fracture, dislocation, or bone destruction. IMPRESSION: No acute osseous abnormalities. Electronically Signed   By: Ulyses Southward M.D.   On: 01/23/2021 09:34    Procedures Procedures (including critical care time)  Medications Ordered in UC Medications - No data to display  Initial Impression / Assessment and Plan / UC Course  I have reviewed the triage vital signs and the nursing notes.  Pertinent labs & imaging results that were available during my care of the patient were reviewed by me and considered in my medical decision making (see chart for details).    Xray ordered without fracture. Will treat with steroid burst to cover arthritis vs other inflammatory response. Recommended follow up with ortho if needed, contact information provided for same.   Final Clinical Impressions(s) / UC Diagnoses   Final diagnoses:  Acute right ankle pain   Discharge Instructions   None    ED Prescriptions     Medication Sig Dispense Auth. Provider   predniSONE (DELTASONE) 20 MG tablet Take 2 tablets (40 mg total) by mouth daily with breakfast for 5 days. 10 tablet Tomi Bamberger, PA-C      PDMP not reviewed this encounter.   Tomi Bamberger, PA-C 01/23/21 [phone]

## 2021-01-24 NOTE — Telephone Encounter (Signed)
Pt returned call. She states she was able to reschedule visit with [facility] regarding her new cpap. Nothing further is needed at this time.

## 2021-01-24 NOTE — Telephone Encounter (Signed)
[facility] pt x 2, line would ring then go silent. WCB.

## 2021-04-24 ENCOUNTER — Encounter: Payer: Self-pay | Admitting: Pulmonary Disease

## 2021-04-24 ENCOUNTER — Other Ambulatory Visit: Payer: Self-pay

## 2021-04-24 ENCOUNTER — Ambulatory Visit: Payer: 59 | Admitting: Pulmonary Disease

## 2021-04-24 VITALS — BP 124/76 | HR 101 | Temp 97.9°F | Ht 64.0 in | Wt 250.0 lb

## 2021-04-24 DIAGNOSIS — G4733 Obstructive sleep apnea (adult) (pediatric): Secondary | ICD-10-CM

## 2021-04-24 NOTE — Progress Notes (Signed)
[facility]  Chief Complaint  Patient presents with   Follow-up    Follow up for her cpap machine. Pt states she is having some issues with her mask due to blowing air in her eyes.     Constitutional:  BP 124/76 (BP Location: Left Arm, Patient Position: Sitting, Cuff Size: Normal)    Pulse (!) 101    Temp 97.9 F (36.6 C) (Oral)    Ht 5\' 4"  (1.626 m)    Wt 250 lb (113.4 kg)    SpO2 95%    BMI 42.91 kg/m   Past Medical History:  Pre-diabetes, Goiter, Headache  Past Surgical History:  She  has a past surgical history that includes Uterine fibroid surgery and Partial hysterectomy (N/A, 03/19/2010).  Brief Summary:  Michaela Alvarado is a 66 y.o. female with obstructive sleep apnea.      Subjective:   She is sleeping better since she started CPAP.  Only issue is that air blows into her right eye from CPAP mask.  Seems to be from exhalation port.  Pressure setting okay otherwise.  She has been struggling to get her weight down.  Physical Exam:   Appearance - well kempt   ENMT - no sinus tenderness, no oral exudate, no LAN, Mallampati 4 airway, no stridor  Respiratory - equal breath sounds bilaterally, no wheezing or rales  CV - s1s2 regular rate and rhythm, no murmurs  Ext - no clubbing, no edema  Skin - no rashes  Psych - normal mood and affect    Sleep Tests:  HST 08/01/20 >> AHI 31.9, SpO2 low 80% Auto CPAP 01/20/21 to 04/19/21 >> used 5 hrs 1 min per night one average.  Average AHI 0.8 with median CPAP 7 and 95 th percentile CPAP 9 cm H2O  Social History:  She  reports that she quit smoking about 26 years ago. Her smoking use included cigarettes. She started smoking about 37 years ago. She has a 2.50 pack-year smoking history. She has never used smokeless tobacco. She reports that she does not drink alcohol and does not use drugs.  Family History:  Her family history includes Diabetes in her mother; Hypertension  in her mother; Leukemia in her father.     Assessment/Plan:   Obstructive sleep apnea. - she is compliant with CPAP and reports benefit from therapy - uses [facility] for her DME - continue auto CPAP 5 to 15 cm H2O - will arrange for refitting of her CPAP mask  Obesity. - encouraged her to keep up with her weight loss efforts - provided contact information for [facility]  Migraine headache. - followed by Dr. Metta Clines with [facility]  Time Spent Involved in Patient Care on Day of Examination:  27 minutes  Follow up:   Patient Instructions  [facility] [phone] - [phone]  Will have your home care company refit your CPAP mask  Follow up in 1 year  Medication List:   Allergies as of 04/24/2021       Reactions   Flagyl [metronidazole] Hives   Peach [prunus Persica] Hives        Medication List        Accurate as of April 24, 2021 11:02 AM. If you have any questions, ask your nurse or doctor.          naproxen 500 MG tablet Commonly known as: NAPROSYN naproxen 500 mg  tablet  TAKE 1 TABLET BY MOUTH 2 TIMES DAILY.        Signature:  Chesley Mires, MD [place] Pager - [phone] 04/24/2021, 11:02 AM

## 2021-04-24 NOTE — Patient Instructions (Signed)
[facility] - [phone] - 3110  Will have your home care company refit your CPAP mask  Follow up in 1 year

## 2021-07-02 ENCOUNTER — Ambulatory Visit
Admission: RE | Admit: 2021-07-02 | Discharge: 2021-07-02 | Disposition: A | Discharge status: Home / Self Care | Payer: 59 | Source: Ambulatory Visit | Attending: Physician Assistant | Admitting: Physician Assistant

## 2021-07-02 VITALS — BP 140/86 | HR 90 | Temp 98.0°F | Resp 18 | Ht 64.0 in | Wt 250.0 lb

## 2021-07-02 DIAGNOSIS — L720 Epidermal cyst: Secondary | ICD-10-CM

## 2021-07-02 MED ORDER — AMOXICILLIN-POT CLAVULANATE 875-125 MG PO TABS: 1.0000 | ORAL_TABLET | Freq: Two times a day (BID) | ORAL | 0 refills | Status: AC

## 2021-07-02 NOTE — Discharge Instructions (Addendum)
Follow up 1 week in [place] to have sutures removed ?Follow up with dermatology ?

## 2021-07-02 NOTE — ED Triage Notes (Signed)
Patient c/o headache from cyst on top of her head.  This cyst is recurrent and causing pain.  Patient does not have a dermatologists here since she moved here.  Patient taken Tylenol for pain. ?

## 2021-07-02 NOTE — ED Provider Notes (Signed)
?[name] ? ? ? ?CSN: [id number] ?Arrival date & time: 07/02/21  1042 ? ? ?  ? ?History   ?Chief Complaint ?Chief Complaint  ?Patient presents with  ? Headache  ?  bad headache throughout the day.Michaela smith[address]ston[address] [place] [phone] (C) / [phone])1955-12-26 - Entered by patient  ? Appointment  ? ? ?HPI ?Michaela Alvarado is a 66 y.o. female.  ? ?Patient here c/w "headache" due to "cyst on head"  she reports she had a cyst removed from that same spot several months ago. She was told when it returned it was just a keloid (she denies history of keloid).   She notes when she hits the cyst it's painful, she thinks she's had discharge.  She has tried Excedrin and tylenol for the HA w/o relief.  HA located frontal, throbbing, does not radiate.   ? ? ?Past Medical History:  ?Diagnosis Date  ? Diabetes mellitus without complication (HCC)   ? ? ?There are no problems to display for this patient. ? ? ?Past Surgical History:  ?Procedure Laterality Date  ? PARTIAL HYSTERECTOMY N/A 03/19/2010  ? UTERINE FIBROID SURGERY    ? ? ?OB History   ?No obstetric history on file. ?  ? ? ? ?Home Medications   ? ?Prior to Admission medications   ?Medication Sig Start Date End Date Taking? Authorizing Provider  ?amoxicillin-clavulanate (AUGMENTIN) 875-125 MG tablet Take 1 tablet by mouth every 12 (twelve) hours for 5 days. 07/02/21 07/07/21 Yes Evern CoreLindquist, Kalyiah Saintil, PA-C  ?naproxen (NAPROSYN) 500 MG tablet naproxen 500 mg tablet ? TAKE 1 TABLET BY MOUTH 2 TIMES DAILY.    [provider]  ? ? ?Family History ?Family History  ?Problem Relation Age of Onset  ? Diabetes Mother   ? Hypertension Mother   ? Leukemia Father   ? ? ?Social History ?Social History  ? ?Tobacco Use  ? Smoking status: Former  ?  Packs/day: 0.25  ?  Years: 10.00  ?  Pack years: 2.50  ?  Types: Cigarettes  ?  Start date: 03/19/1984  ?  Quit date: 03/20/1995  ?  Years since quitting: 26.3  ? Smokeless tobacco: Never   ?Substance Use Topics  ? Alcohol use: Never  ? Drug use: Never  ? ? ? ?Allergies   ?Flagyl [metronidazole] and Peach [prunus persica] ? ? ?Review of Systems ?Review of Systems  ?Constitutional:  Negative for chills, fatigue and fever.  ?Gastrointestinal:  Negative for nausea and vomiting.  ?Musculoskeletal:  Positive for myalgias. Negative for arthralgias.  ?Skin:  Positive for wound. Negative for color change.  ?Neurological:  Positive for headaches. Negative for dizziness, syncope, speech difficulty, weakness, light-headedness and numbness.  ?Psychiatric/Behavioral:  Negative for sleep disturbance.   ? ? ?Physical Exam ?Triage Vital Signs ?ED Triage Vitals  ?Enc Vitals Group  ?   BP 07/02/21 1144 140/86  ?   Pulse Rate 07/02/21 1144 90  ?   Resp 07/02/21 1144 18  ?   Temp 07/02/21 1144 98 ?F (36.7 ?C)  ?   Temp Source 07/02/21 1144 Oral  ?   SpO2 07/02/21 1144 92 %  ?   Weight 07/02/21 1146 250 lb (113.4 kg)  ?   Height 07/02/21 1146 5\' 4"  (1.626 m)  ?   Head Circumference --   ?   Peak Flow --   ?   Pain Score 07/02/21 1146 7  ?   Pain Loc --   ?   Pain Edu? --   ?  Excl. in GC? --   ? ?No data found. ? ?Updated Vital Signs ?BP 140/86 (BP Location: Left Arm)   Pulse 90   Temp 98 ?F (36.7 ?C) (Oral)   Resp 18   Ht 5\' 4"  (1.626 m)   Wt 250 lb (113.4 kg)   SpO2 92%   BMI 42.91 kg/m?  ? ?Visual Acuity ?Right Eye Distance:   ?Left Eye Distance:   ?Bilateral Distance:   ? ?Right Eye Near:   ?Left Eye Near:    ?Bilateral Near:    ? ?Physical Exam ?Vitals and nursing note reviewed.  ?Constitutional:   ?   General: She is not in acute distress. ?   Appearance: Normal appearance. She is not ill-appearing.  ?HENT:  ?   Head: Normocephalic and atraumatic.  ?   Comments: Soft, non-tender lesion 2 cm diameter on crown of head with dried sebaceous material extending above crown of head ~ 8 mm.  No surrounding erythema ?   Right Ear: Tympanic membrane and ear canal normal.  ?   Left Ear: Tympanic membrane and ear canal  normal.  ?   Nose: No congestion or rhinorrhea.  ?   Mouth/Throat:  ?   Pharynx: No oropharyngeal exudate or posterior oropharyngeal erythema.  ?Eyes:  ?   General: No scleral icterus. ?   Extraocular Movements: Extraocular movements intact.  ?   Conjunctiva/sclera: Conjunctivae normal.  ?Cardiovascular:  ?   Rate and Rhythm: Normal rate and regular rhythm.  ?   Heart sounds: No murmur heard. ?Pulmonary:  ?   Effort: Pulmonary effort is normal. No respiratory distress.  ?   Breath sounds: Normal breath sounds. No wheezing or rales.  ?Musculoskeletal:  ?   Cervical back: Normal range of motion. No rigidity.  ?Skin: ?   Capillary Refill: Capillary refill takes less than 2 seconds.  ?   Coloration: Skin is not jaundiced.  ?   Findings: No rash.  ?Neurological:  ?   General: No focal deficit present.  ?   Mental Status: She is alert and oriented to person, place, and time.  ?   GCS: GCS eye subscore is 4. GCS verbal subscore is 5. GCS motor subscore is 6.  ?   Cranial Nerves: Cranial nerves 2-12 are intact.  ?   Motor: No weakness, tremor or abnormal muscle tone.  ?   Gait: Gait normal.  ?Psychiatric:     ?   Mood and Affect: Mood normal.     ?   Behavior: Behavior normal.  ? ? ? ?UC Treatments / Results  ?Labs ?(all labs ordered are listed, but only abnormal results are displayed) ?Labs Reviewed - No data to display ? ?EKG ? ? ?Radiology ?No results found. ? ?Procedures ?Incision and Drainage ? ?Date/Time: 07/02/2021 1:15 PM ?Performed by: 07/04/2021, PA-C ?Authorized by: Evern Core, PA-C  ? ?Consent:  ?  Consent obtained:  Verbal ?  Consent given by:  Patient ?  Risks discussed:  Pain and infection ?  Alternatives discussed:  Referral ?Universal protocol:  ?  Patient identity confirmed:  Verbally with patient ?Location:  ?  Type:  Cyst ?  Size:  2 cm ?  Location:  Head ?  Head location:  Scalp ?Pre-procedure details:  ?  Skin preparation:  Antiseptic wash and povidone-iodine ?Sedation:  ?  Sedation type:   None ?Anesthesia:  ?  Anesthesia method:  Local infiltration ?  Local anesthetic:  Lidocaine 1% w/o epi ?Procedure type:  ?  Complexity:  Complex ?Procedure details:  ?  Wound management:  Probed and deloculated and irrigated with saline ?  Drainage characteristics: sebum. ?  Drainage amount:  Moderate ?  Wound treatment: sutured, 3-0 sutures, simple interrupted, 2 sutures in place. ?Post-procedure details:  ?  Procedure completion:  Tolerated well, no immediate complications (including critical care time) ? ?Medications Ordered in UC ?Medications - No data to display ? ?Initial Impression / Assessment and Plan / UC Course  ?I have reviewed the triage vital signs and the nursing notes. ? ?Pertinent labs & imaging results that were available during my care of the patient were reviewed by me and considered in my medical decision making (see chart for details). ? ?  ? ?Cyst appears to be ruptured epidermal inclusion cyst ?Sac removed ?She will be in [place] in 1 week, recommend she follows up with provider in [place] for suture removal ?She may need revision once skin heals, follow up with dermatology ?Patient stated headache resolved after removal of cyst ?Final Clinical Impressions(s) / UC Diagnoses  ? ?Final diagnoses:  ?Epidermal inclusion cyst  ? ? ? ?Discharge Instructions   ? ?  ?Follow up 1 week in [place] to have sutures removed ?Follow up with dermatology ? ? ? ?ED Prescriptions   ? ? Medication Sig Dispense Auth. Provider  ? amoxicillin-clavulanate (AUGMENTIN) 875-125 MG tablet Take 1 tablet by mouth every 12 (twelve) hours for 5 days. 10 tablet Evern Core, PA-C  ? ?  ? ?PDMP not reviewed this encounter. ?  ?Evern Core, PA-C ?07/02/21 1319 ? ?

## 2021-10-26 IMAGING — MG MM DIGITAL SCREENING BILAT W/ TOMO AND CAD
6 of 10 series · 6 of 30 positions shown · non-contrast
Comparison: Previous exam(s).

CLINICAL DATA: Screening.

EXAM:
DIGITAL SCREENING BILATERAL MAMMOGRAM WITH TOMOSYNTHESIS AND CAD
TECHNIQUE: Bilateral screening digital craniocaudal and mediolateral oblique
mammograms were obtained. Bilateral screening digital breast
tomosynthesis was performed. The images were evaluated with
computer-aided detection.

[R MLO synth-2D (1 of 2)]
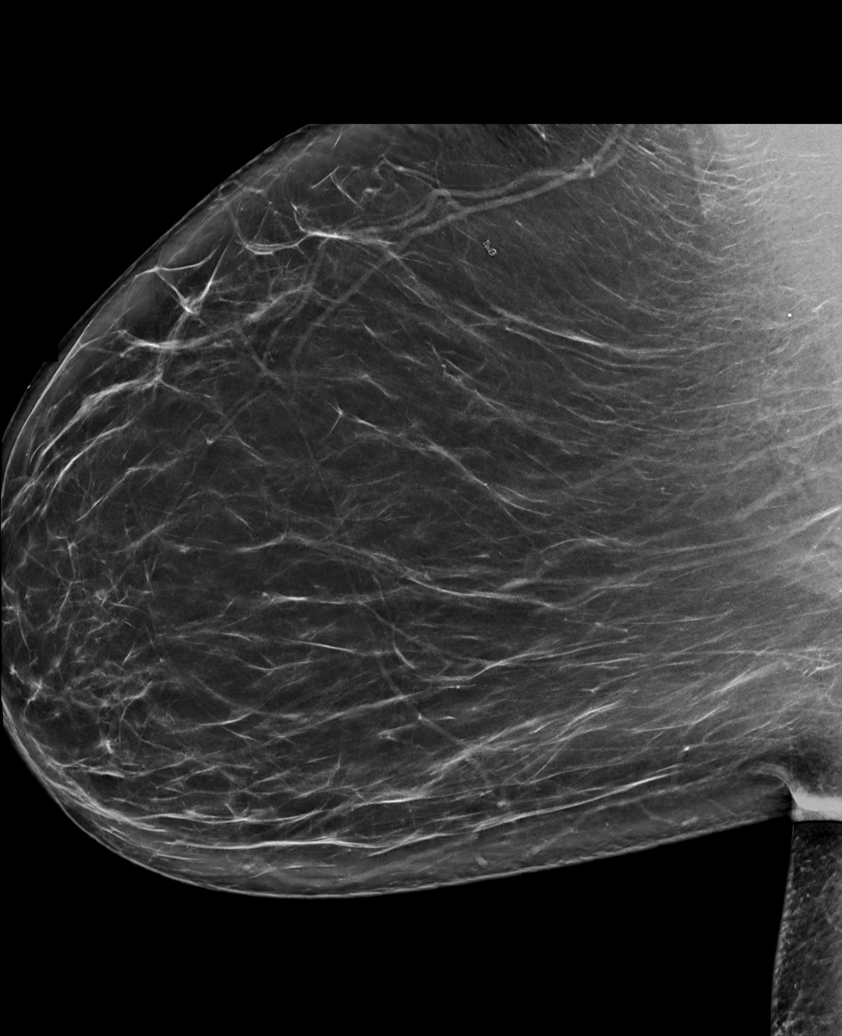

[L MLO synth-2D]
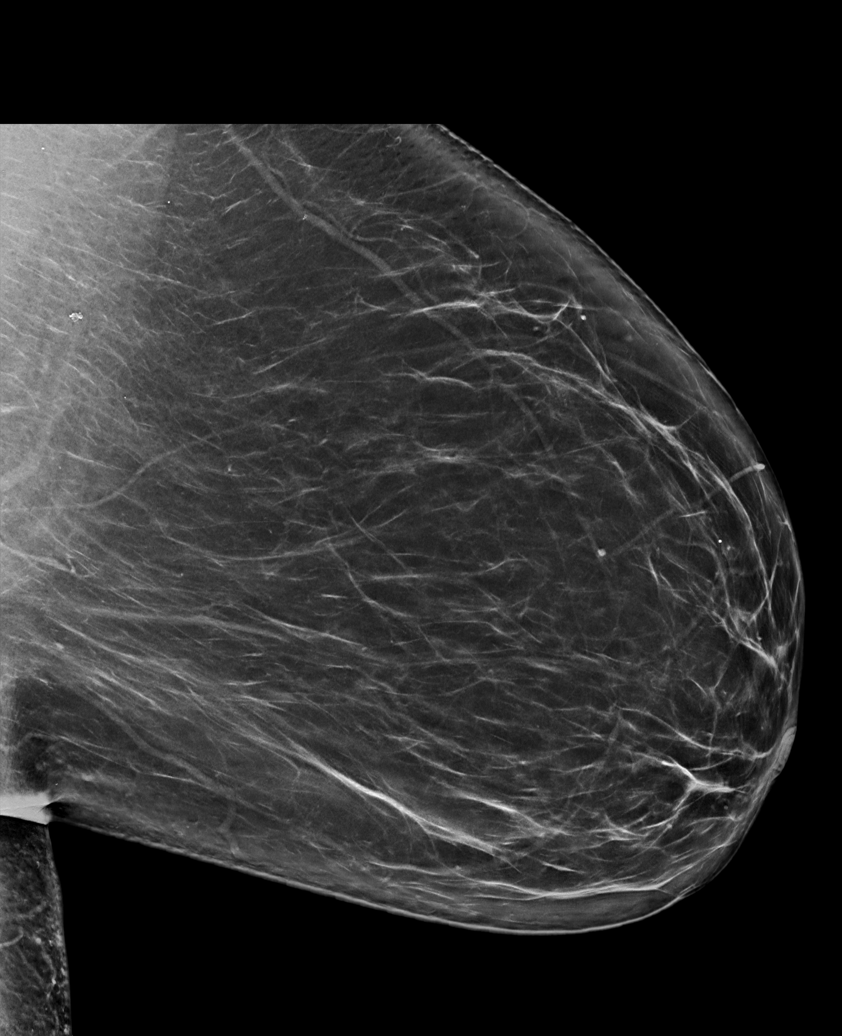

[L CC synth-2D]
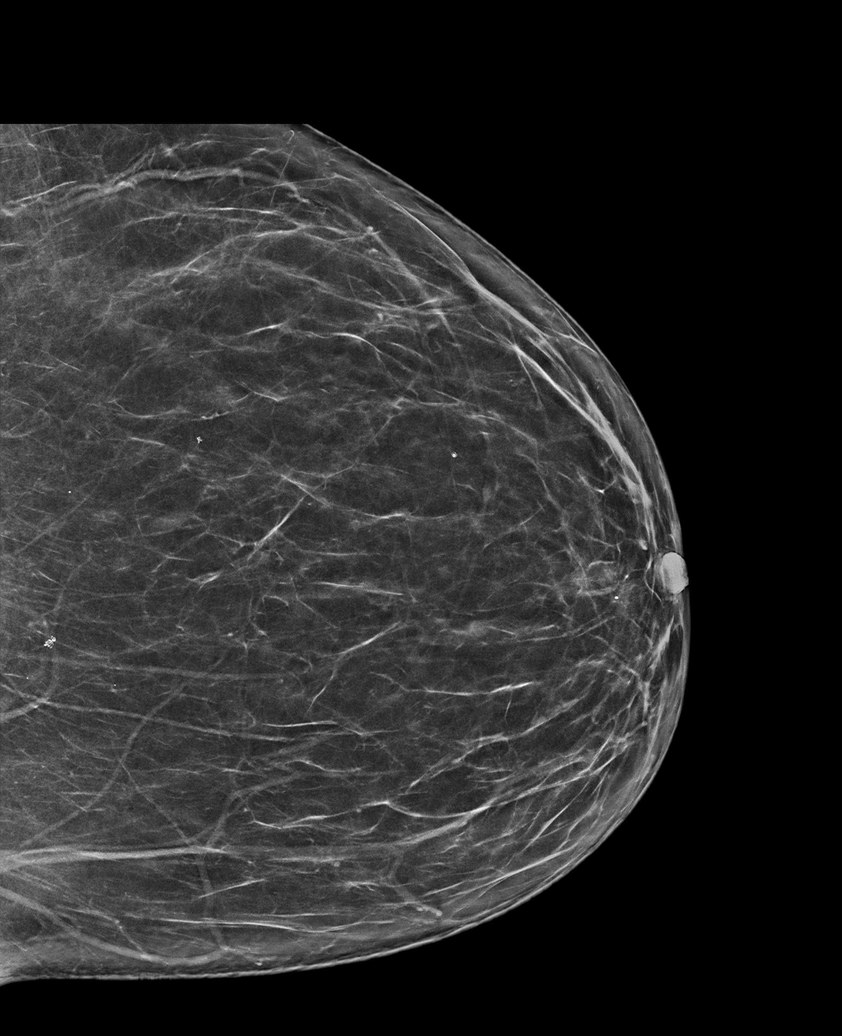

[R MLO synth-2D (2 of 2)]
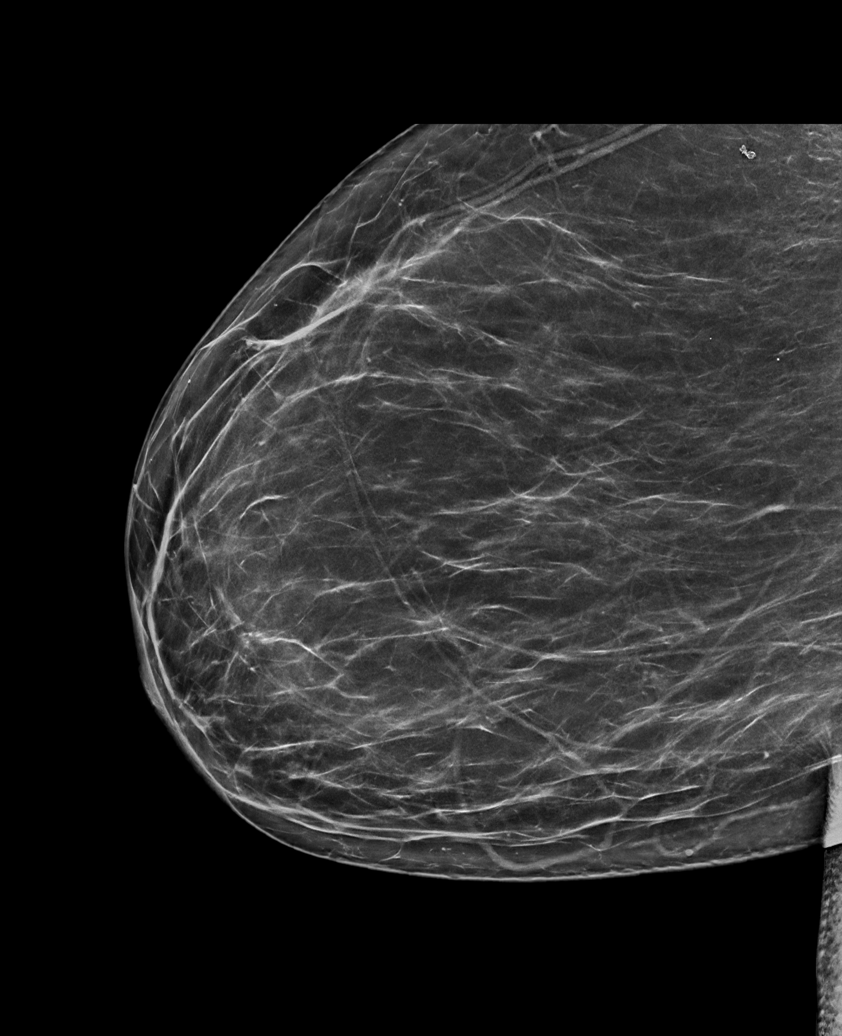

[R CC synth-2D]
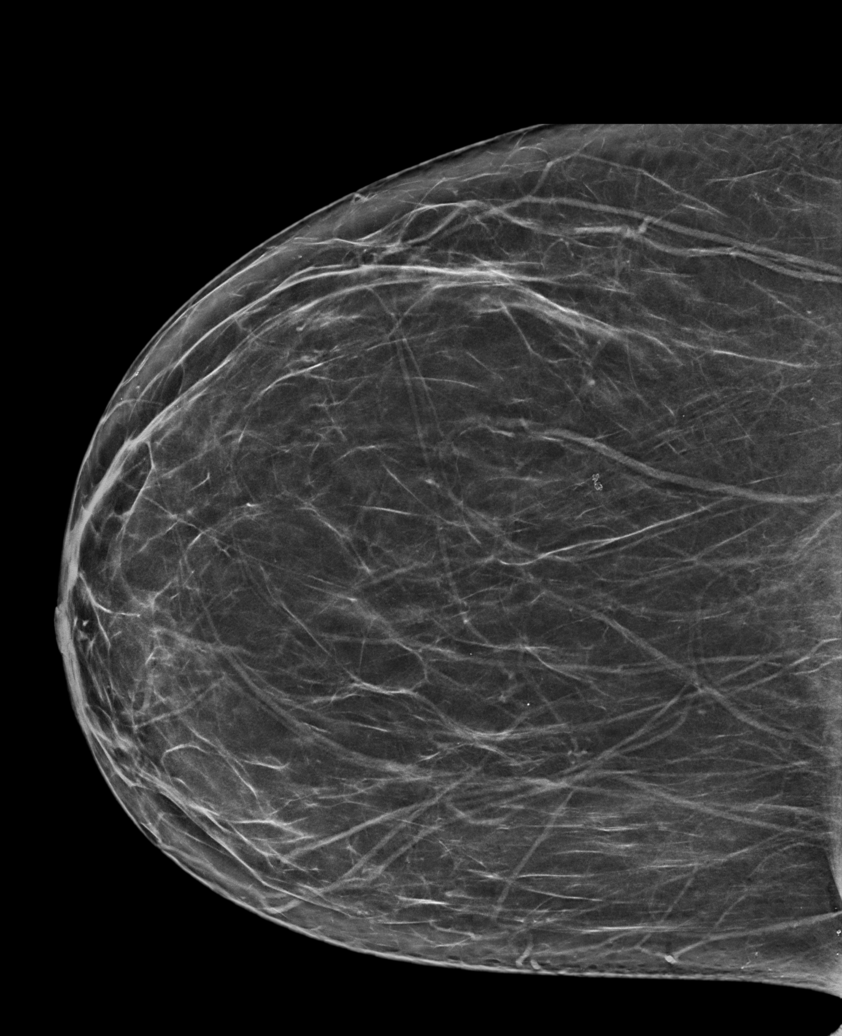

[R MLO tomo · tomo slice 42/83.0]
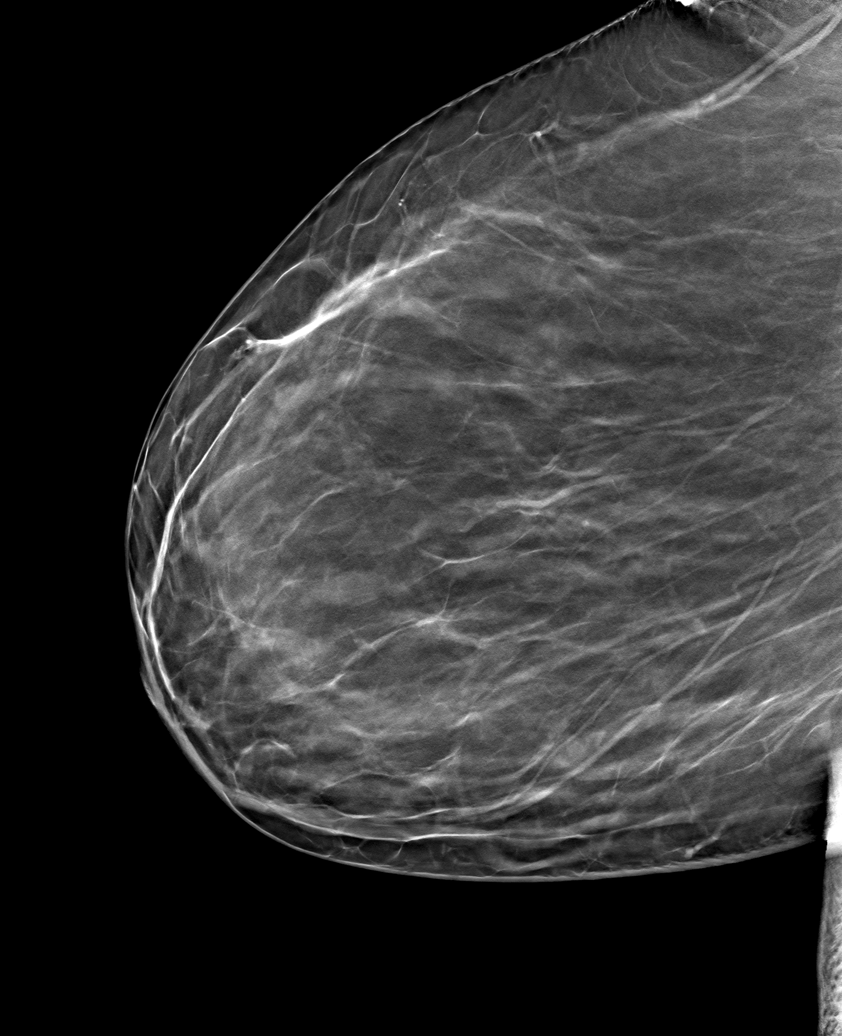

[6 of 30 positions shown; findings below may reference images not displayed]

ACR Breast Density Category b: There are scattered areas of
fibroglandular density.
FINDINGS: There are no findings suspicious for malignancy.
IMPRESSION: No mammographic evidence of malignancy. A result letter of this
screening mammogram will be mailed directly to the patient.

RECOMMENDATION:
Screening mammogram in one year. (Code:51-O-LD2)

BI-RADS CATEGORY  1: Negative.

## 2021-11-08 ENCOUNTER — Ambulatory Visit
Admission: EM | Admit: 2021-11-08 | Discharge: 2021-11-08 | Disposition: A | Discharge status: Home / Self Care | Payer: 59 | Attending: Family Medicine | Admitting: Family Medicine

## 2021-11-08 DIAGNOSIS — H8110 Benign paroxysmal vertigo, unspecified ear: Secondary | ICD-10-CM | POA: Diagnosis not present

## 2021-11-08 DIAGNOSIS — R110 Nausea: Secondary | ICD-10-CM | POA: Diagnosis not present

## 2021-11-08 MED ORDER — ONDANSETRON 4 MG PO TBDP: 4.0000 mg | ORAL_TABLET | Freq: Three times a day (TID) | ORAL | 0 refills | Status: AC | PRN

## 2021-11-08 MED ORDER — MECLIZINE HCL 25 MG PO TABS: 25.0000 mg | ORAL_TABLET | Freq: Three times a day (TID) | ORAL | 0 refills | Status: AC | PRN

## 2021-11-08 NOTE — ED Provider Notes (Signed)
[facility]   [id number] 11/08/21 Arrival Time: 1434  ASSESSMENT & PLAN:  1. Benign paroxysmal positional vertigo, unspecified laterality   2. Nausea     Normal neurologic exam. No suspicion for ICH or SAH. No indication for neurodiagnostic imaging at this time. H/O similar before.  Meds ordered this encounter  Medications   meclizine (ANTIVERT) 25 MG tablet    Sig: Take 1 tablet (25 mg total) by mouth 3 (three) times daily as needed for dizziness.    Dispense:  30 tablet    Refill:  0   ondansetron (ZOFRAN-ODT) 4 MG disintegrating tablet    Sig: Take 1 tablet (4 mg total) by mouth every 8 (eight) hours as needed for nausea or vomiting.    Dispense:  15 tablet    Refill:  0   Recommend:  Follow-up Information     Schedule an appointment as soon as possible for a visit  with [facility].   Contact information: [address] [place] [address] [phone]                Reassured that these symptoms do not appear to represent a serious or threatening condition. This is generally a self-limited temporary but uncomfortable situation. Rest, avoid potentially dangerous activities (such as driving or working with machinery or at heights). Use OTC Meclizine prn. Will proceed to the ED if she develops other symptoms such as alterations of speech, swallowing, vision, motor/sensory systems, or if dizziness worsens.  Reviewed expectations re: course of current medical issues. Questions answered. Outlined signs and symptoms indicating need for more acute intervention. Patient verbalized understanding. After Visit Summary given.   SUBJECTIVE:  Michaela Alvarado is a 66 y.o. female who reports gradual onset of dizziness described as mild vertigo and lightheadedness. Mild assoc nausea without emesis. Current symptoms first noted  over past few days  and have progressed to a point and plateaued. Aggravating  factors:  bending forward . Denies gait problems, headaches, seizures, speech problems, and weakness as well as otalgia, tinnitus, and hearing loss. Recent infections: none. Head trauma: denied. Noise exposure:  none . No associated SOB, CP, or palpatations reported. Recent travel: none. Reports normal bowel/bladder habits. H/O similar; meclizine helped. No tx PTA.  Social History   Substance and Sexual Activity  Alcohol Use Never   Social History   Tobacco Use  Smoking Status Former   Packs/day: 0.25   Years: 10.00   Total pack years: 2.50   Types: Cigarettes   Start date: 03/19/1984   Quit date: 03/20/1995   Years since quitting: 26.6  Smokeless Tobacco Never   OBJECTIVE:  Vitals:   11/08/21 1447  BP: (!) 140/82  Pulse: 82  Resp: 17  Temp: 98.3 F (36.8 C)  TempSrc: Oral  SpO2: 96%    General appearance: alert; no distress Eyes: PERRLA; EOMI; conjunctiva normal HENT: normocephalic; atraumatic; TMs normal; nasal mucosa normal; oral mucosa normal Neck: supple with FROM Lungs: clear to auscultation bilaterally Heart: regular rate and rhythm Abdomen: soft, non-tender; bowel sounds normal Extremities: no cyanosis or edema; symmetrical with no gross deformities Skin: warm and dry Neurologic: normal gait; DTR's normal and symmetric; CN 2-12 grossly intact; rapid changes in position during the exam do precipitate brief dizziness without appreciated nystagmus. Psychological: alert and cooperative; normal mood and affect   Allergies  Allergen Reactions   Flagyl [Metronidazole] Hives   Peach [Prunus Persica] Hives    Past Medical  History:  Diagnosis Date   Diabetes mellitus without complication (HCC)    Social History   Socioeconomic History   Marital status: Married    Spouse name: Not on file   Number of children: Not on file   Years of education: Not on file   Highest education level: Not on file  Occupational History   Not on file  Tobacco Use   Smoking  status: Former    Packs/day: 0.25    Years: 10.00    Total pack years: 2.50    Types: Cigarettes    Start date: 03/19/1984    Quit date: 03/20/1995    Years since quitting: 26.6   Smokeless tobacco: Never  Substance and Sexual Activity   Alcohol use: Never   Drug use: Never   Sexual activity: Not on file  Other Topics Concern   Not on file  Social History Narrative   Right Handed   Lives in two story home   Drinks 1-2 cups of coffee daily   Social Determinants of Health   Financial Resource Strain: Not on file  Food Insecurity: Not on file  Transportation Needs: Not on file  Physical Activity: Not on file  Stress: Not on file  Social Connections: Not on file  Intimate Partner Violence: Not on file   Family History  Problem Relation Age of Onset   Diabetes Mother    Hypertension Mother    Leukemia Father    Past Surgical History:  Procedure Laterality Date   PARTIAL HYSTERECTOMY N/A 03/19/2010   UTERINE FIBROID SURGERY         Mardella Layman, MD 11/08/21 1512

## 2021-11-08 NOTE — ED Triage Notes (Signed)
Pt presents with intermittent vertigo with some nausea for past few days; pt states she had this before awhile ago and meclizine worked for her; pt states she recently had a visit with her PCP and everything was fine.

## 2021-11-16 IMAGING — DX DG ANKLE COMPLETE 3+V*R*
3 series · 3 of 3 positions shown · non-contrast
Comparison: None

CLINICAL DATA: Pain and swelling medially for 1 week radiating to
RIGHT foot, denies injury, increased symptoms after walking around

EXAM:
RIGHT ANKLE - COMPLETE 3+ VIEW

[ankle ap]
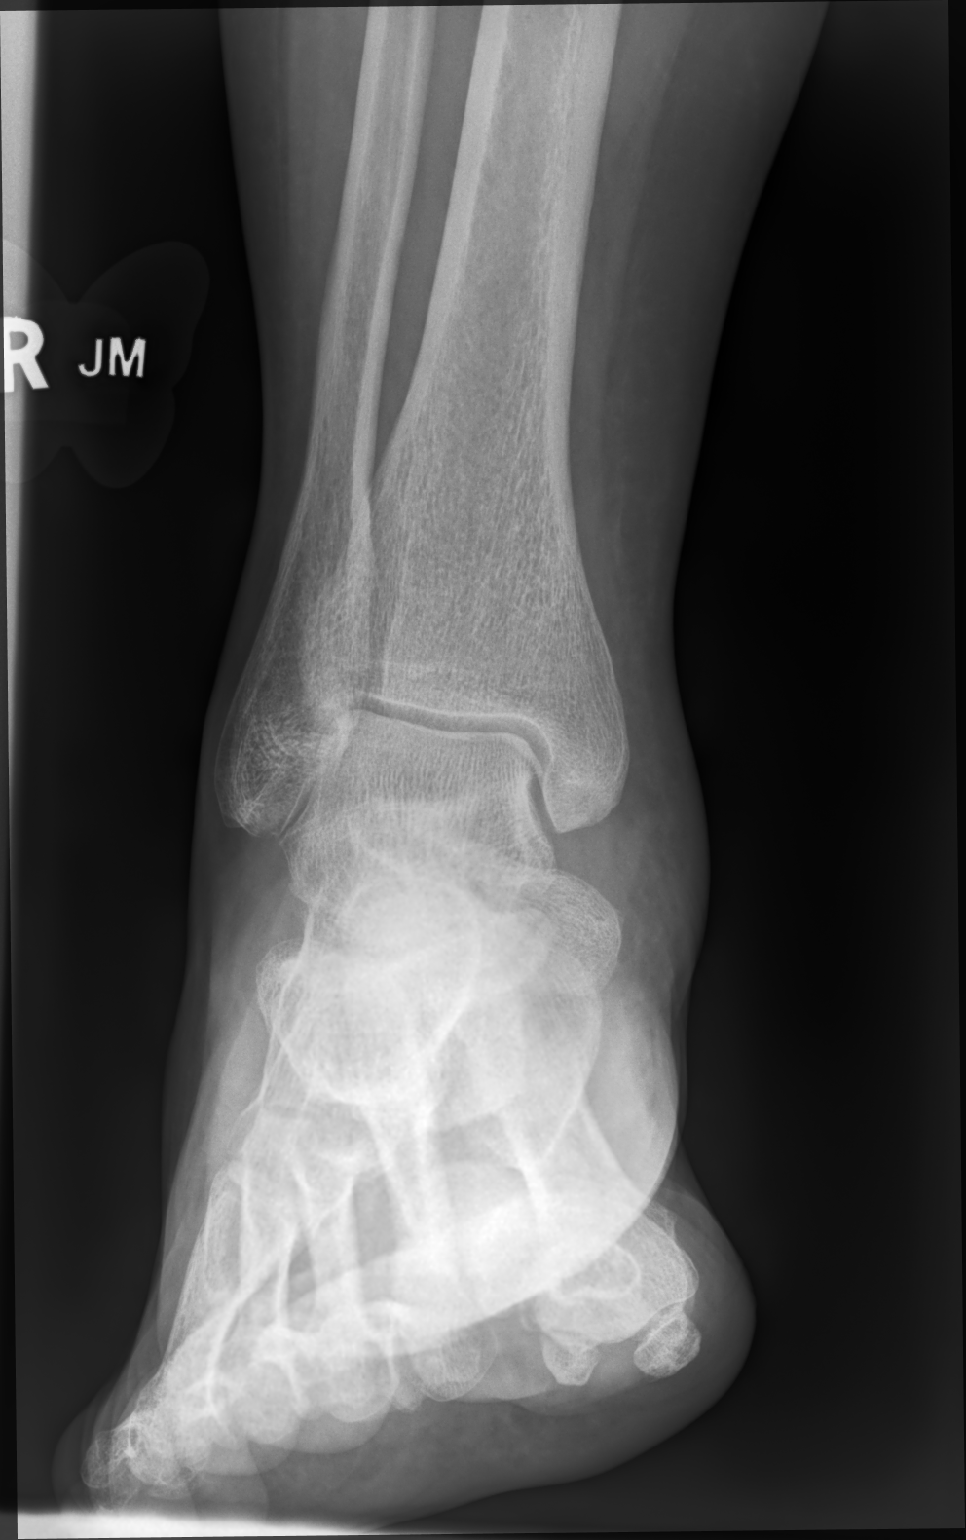

[ankle medial oblique]
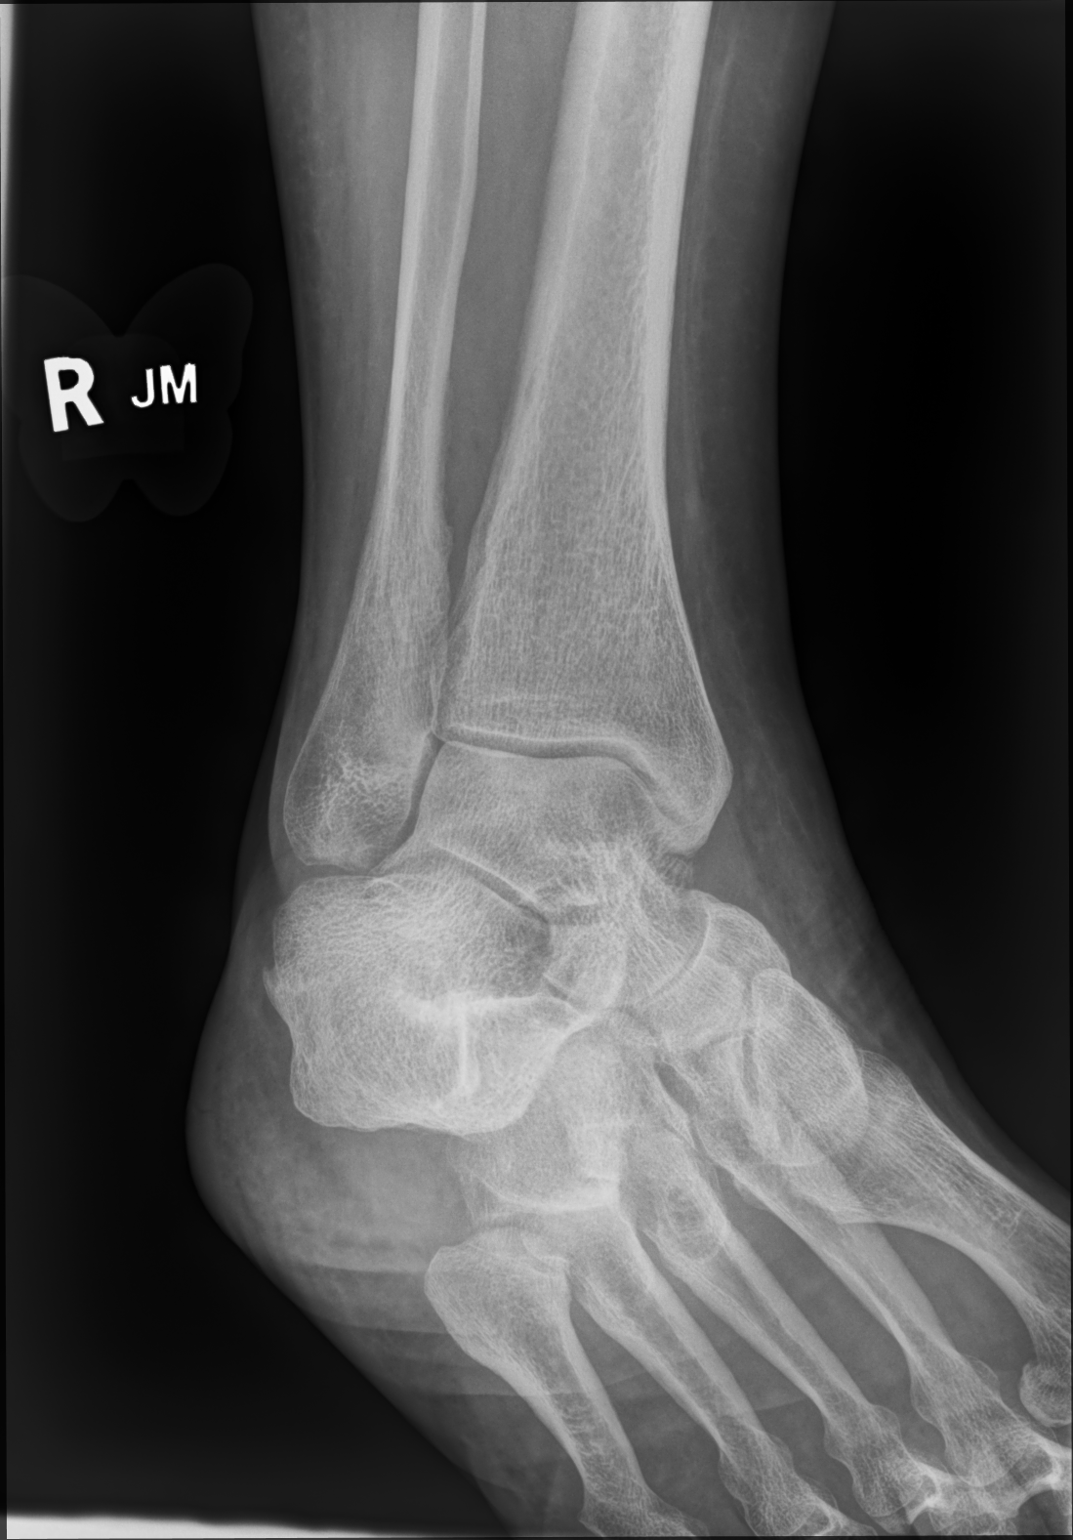

[ankle lat]
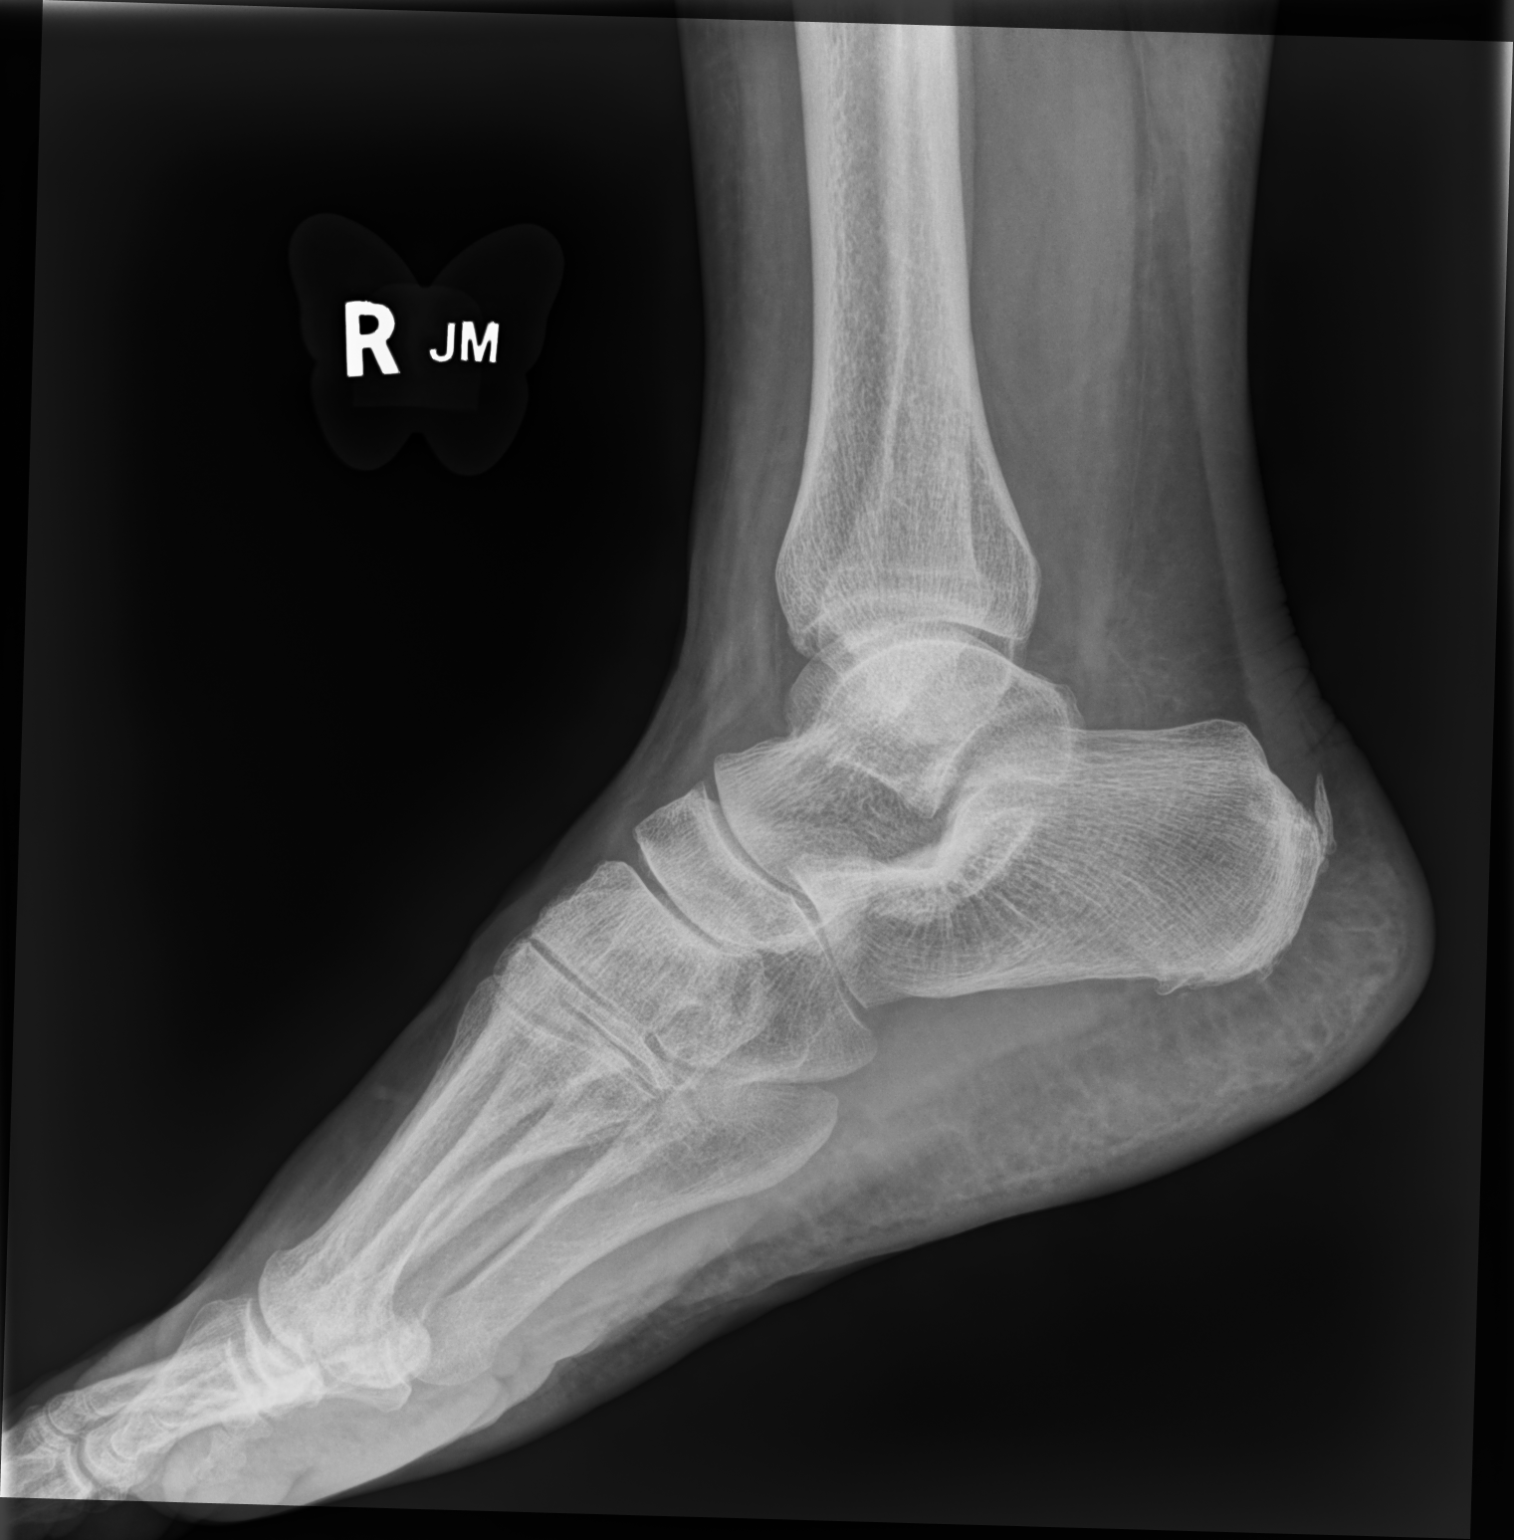

[3 of 3 positions shown; findings below may reference images not displayed]

FINDINGS: Soft tissue swelling greatest medially.

Osseous mineralization low normal.

Joint spaces preserved.

No acute fracture, dislocation, or bone destruction.
IMPRESSION: No acute osseous abnormalities.

## 2021-12-04 ENCOUNTER — Other Ambulatory Visit: Payer: Self-pay | Admitting: Family Medicine

## 2021-12-04 DIAGNOSIS — Z1231 Encounter for screening mammogram for malignant neoplasm of breast: Secondary | ICD-10-CM

## 2022-01-03 ENCOUNTER — Ambulatory Visit
Admission: RE | Admit: 2022-01-03 | Discharge: 2022-01-03 | Disposition: A | Discharge status: Home / Self Care | Payer: 59 | Source: Ambulatory Visit | Attending: Family Medicine | Admitting: Family Medicine

## 2022-01-03 DIAGNOSIS — Z1231 Encounter for screening mammogram for malignant neoplasm of breast: Secondary | ICD-10-CM

## 2022-01-14 ENCOUNTER — Encounter: Payer: Self-pay | Admitting: Gastroenterology

## 2022-01-14 NOTE — Progress Notes (Signed)
Received the referral packet from [facility] (Dr. Randel Pigg). Laterally spreading cecal polyp (approximately 2 cm per my discussion with provider). No pathology report and no colonoscopy report is noted at this time. I am willing to move forward with the patient meeting me for a clinic visit and likely colonoscopy in early 2024 based on availability at this time. We will plan to get the color images mailed, the black-and-white images faxed, any pathology faxed.  We will scanned the referral into the chart.   Justice Britain, MD [facility] Office # [phone]

## 2022-02-06 ENCOUNTER — Encounter: Payer: Self-pay | Admitting: Gastroenterology

## 2022-03-05 NOTE — Progress Notes (Signed)
Thanks for doing this. GM

## 2022-03-27 ENCOUNTER — Telehealth: Payer: Self-pay | Admitting: Gastroenterology

## 2022-03-27 ENCOUNTER — Ambulatory Visit: Payer: 59 | Admitting: Gastroenterology

## 2022-03-27 NOTE — Telephone Encounter (Signed)
Good Morning Dr. Rush Landmark,  Patient called stating that she wanted to reschedule her appointment with you for today at 11:10 due to the bad weather.   Patient was rescheduled for 2/23 at 10:10

## 2022-03-27 NOTE — Telephone Encounter (Signed)
Understandable. Thank you for update. No charge. GM

## 2022-05-11 ENCOUNTER — Ambulatory Visit: Payer: 59 | Admitting: Gastroenterology

## 2022-05-11 ENCOUNTER — Encounter: Payer: Self-pay | Admitting: Gastroenterology

## 2022-05-11 VITALS — BP 138/76 | HR 90 | Ht 65.0 in | Wt 252.0 lb

## 2022-05-11 DIAGNOSIS — K635 Polyp of colon: Secondary | ICD-10-CM | POA: Diagnosis not present

## 2022-05-11 DIAGNOSIS — Z86010 Personal history of colonic polyps: Secondary | ICD-10-CM | POA: Diagnosis not present

## 2022-05-11 MED ORDER — NA SULFATE-K SULFATE-MG SULF 17.5-3.13-1.6 GM/177ML PO SOLN: 1.0000 | ORAL | 0 refills | Status: DC

## 2022-05-11 NOTE — Patient Instructions (Signed)
Your provider has requested that you go to the basement level for lab work next week . Press "B" on the elevator. The lab is located at the first door on the left as you exit the elevator.  We have sent the following medications to your pharmacy for you to pick up at your convenience: [place] have been scheduled for a colonoscopy. Please follow written instructions given to you at your visit today.  Please pick up your prep supplies at the pharmacy within the next 1-3 days. If you use inhalers (even only as needed), please bring them with you on the day of your procedure.  Due to recent changes in healthcare laws, you may see the results of your imaging and laboratory studies on MyChart before your provider has had a chance to review them.  We understand that in some cases there may be results that are confusing or concerning to you. Not all laboratory results come back in the same time frame and the provider may be waiting for multiple results in order to interpret others.  Please give [place] 48 hours in order for your provider to thoroughly review all the results before contacting the office for clarification of your results.   _______________________________________________________  If your blood pressure at your visit was 140/90 or greater, please contact your primary care physician to follow up on this.  _______________________________________________________  If you are age 65 or older, your body mass index should be between 23-30. Your Body mass index is 41.93 kg/m. If this is out of the aforementioned range listed, please consider follow up with your Primary Care Provider.  If you are age 65 or younger, your body mass index should be between 19-25. Your Body mass index is 41.93 kg/m. If this is out of the aformentioned range listed, please consider follow up with your Primary Care Provider.   ________________________________________________________  The [facility] providers would  like to encourage you to use [facility] to communicate with providers for non-urgent requests or questions.  Due to long hold times on the telephone, sending your provider a message by [facility] may be a faster and more efficient way to get a response.  Please allow 48 business hours for a response.  Please remember that this is for non-urgent requests.  _______________________________________________________  Thank you for choosing me and [facility].  Dr. Rush Landmark

## 2022-05-11 NOTE — Progress Notes (Signed)
[facility] [place] VISIT   Primary Care Provider Gaynelle Arabian, MD [address] [place] [address] [phone]  Referring Provider Gaynelle Arabian, MD [address] [place],  [place] [address] [phone]  Patient Profile: Michaela Alvarado is a 67 y.o. female with a pmh significant for  The patient presents to the [facility] for an evaluation and management of problem(s) noted below:  Problem List No diagnosis found.  History of Present Illness    The patient does/does not take NSAIDs or BC/Goody Powder. Patient has/has not had an EGD. Patient has/has not had a Colonoscopy.  GI Review of Systems Positive as above Negative for  Pyrosis; Reflux; Regurgitation; Dysphagia; Odynophagia; Globus; Post-prandial cough; Nocturnal cough; Nasal regurgitation; Epigastric pain; Nausea; Vomiting; Hematemesis; Jaundice; Change in Appetite; Early satiety; Abdominal pain; Abdominal bloating; Eructation; Flatulence; Change in BM Frequency; Change in BM Consistency; Constipation; Diarrhea; Incontinence; Urgency; Tenesmus; Hematochezia; Melena  Review of Systems General: Denies fevers/chills/weight loss/night sweats HEENT: Denies oral lesions/sore throat/headaches/visual changes Cardiovascular: Denies chest pain/palpitations Pulmonary: Denies shortness of breath/cough Gastroenterological: See HPI Genitourinary: Denies darkened urine or hematuria Hematological: Denies easy bruising/bleeding Endocrine: Denies temperature intolerance Dermatological: Denies skin changes Psychological: Mood is stable Allergy & Immunology: Denies severe allergic reactions Musculoskeletal: Denies new arthralgias   Medications Current Outpatient Medications  Medication Sig Dispense Refill   meclizine (ANTIVERT) 25 MG tablet Take 1 tablet (25 mg total) by mouth 3 (three) times daily as needed for dizziness. 30 tablet 0    naproxen (NAPROSYN) 500 MG tablet naproxen 500 mg tablet  TAKE 1 TABLET BY MOUTH 2 TIMES DAILY.     ondansetron (ZOFRAN-ODT) 4 MG disintegrating tablet Take 1 tablet (4 mg total) by mouth every 8 (eight) hours as needed for nausea or vomiting. 15 tablet 0   No current facility-administered medications for this visit.    Allergies Allergies  Allergen Reactions   Flagyl [Metronidazole] Hives   Peach [Prunus Persica] Hives    Histories Past Medical History:  Diagnosis Date   Diabetes mellitus without complication (HCC)    Past Surgical History:  Procedure Laterality Date   PARTIAL HYSTERECTOMY N/A 03/19/2010   UTERINE FIBROID SURGERY     Social History   Socioeconomic History   Marital status: Married    Spouse name: Not on file   Number of children: 2   Years of education: Not on file   Highest education level: Not on file  Occupational History   Not on file  Tobacco Use   Smoking status: Former    Packs/day: 0.25    Years: 10.00    Total pack years: 2.50    Types: Cigarettes    Start date: 03/19/1984    Quit date: 03/20/1995    Years since quitting: 27.1   Smokeless tobacco: Never  Vaping Use   Vaping Use: Never used  Substance and Sexual Activity   Alcohol use: Never   Drug use: Never   Sexual activity: Not on file  Other Topics Concern   Not on file  Social History Narrative   Right Handed   Lives in two story home   Drinks 1-2 cups of coffee daily   Social Determinants of Health   Financial Resource Strain: Not on file  Food Insecurity: Not on file  Transportation Needs: Not on file  Physical Activity: Not on file  Stress: Not on file  Social Connections: Not on file  Intimate Partner Violence: Not on file  Family History  Problem Relation Age of Onset   Diabetes Mother    Hypertension Mother    Leukemia Father    I have reviewed her medical, social, and family history in detail and updated the electronic medical record as necessary.     PHYSICAL EXAMINATION  Ht '5\' 5"'$  (1.651 m)   Wt 252 lb (114.3 kg)   BMI 41.93 kg/m  Wt Readings from Last 3 Encounters:  05/11/22 252 lb (114.3 kg)  07/02/21 250 lb (113.4 kg)  04/24/21 250 lb (113.4 kg)   GEN: NAD, appears stated age, doesn't appear chronically ill PSYCH: Cooperative, without pressured speech EYE: Conjunctivae pink, sclerae anicteric ENT: MMM, without oral ulcers, no erythema or exudates noted NECK: Supple CV: RR without R/Gs  RESP: CTAB posteriorly, without wheezing GI: NABS, soft, NT/ND, without rebound or guarding, no HSM appreciated GU: DRE shows MSK/EXT: _ edema, no palmar erythema SKIN: No jaundice, no spider angiomata, no concerning rashes NEURO:  Alert & Oriented x 3, no focal deficits, no evidence of asterixis   REVIEW OF DATA  I reviewed the following data at the time of this encounter:  GI Procedures and Studies  ***  Laboratory Studies  ***  Imaging Studies  ***   ASSESSMENT  Ms. Strate is a 67 y.o. female with a pmh significant for The patient is seen today for evaluation and management of:  No diagnosis found.  ***   PLAN  There are no diagnoses linked to this encounter.   No orders of the defined types were placed in this encounter.   New Prescriptions   No medications on file   Modified Medications   No medications on file    Planned Follow Up No follow-ups on file.   Total Time in Face-to-Face and in Coordination of Care for patient including independent/personal interpretation/review of prior testing, medical history, examination, medication adjustment, communicating results with the patient directly, and documentation within the EHR is ***.   Justice Britain, MD [facility] Office # [id number]

## 2022-05-13 ENCOUNTER — Encounter: Payer: Self-pay | Admitting: Gastroenterology

## 2022-05-13 DIAGNOSIS — K635 Polyp of colon: Secondary | ICD-10-CM | POA: Insufficient documentation

## 2022-05-13 DIAGNOSIS — Z86010 Personal history of colonic polyps: Secondary | ICD-10-CM | POA: Insufficient documentation

## 2022-05-15 NOTE — Progress Notes (Unsigned)
NEUROLOGY FOLLOW UP OFFICE NOTE  Michaela Alvarado [id number]  Assessment/Plan:   1.  Migraine without aura, without status migrainosus, not intractable 2.  Osteoma involving the skull    1.  Start nortriptyline '10mg'$  at bedtime.  We can increase to '25mg'$  at bedtime in 4 weeks if needed. 2.  Limit use of pain relievers to no more than 2 days out of week to prevent risk of rebound or medication-overuse headache. 3.  Keep headache diary 4.  Follow up with the eye doctor 5  Follow up with me in 4 to 5 months.     Subjective:  Michaela Alvarado is a 67 year old right-handed female with diabetes and nodular goiter biopsied in 02/2019 with negative pathology who follows up for headache.  UPDATE: Last seen in initial consultation in April 2022.  At that time, I had ordered a sleep study.  Testing demonstrated sleep apnea. She is using a CPAP but thinks she needs an adjustment.  Headaches did improve initially but now back to 3-4 days a week.  Usually lasts several hours/half a day with Tylenol  Over the past week, she notes a brief streak in her right eye lasting for less than a second, such as when she blinks.  She is going to see the eye doctor.    Current NSAIDS/analgesics:  Naproxen '500mg'$  Current triptans:  none Current ergotamine:  none Current anti-emetic:  Zofran ODT '4mg'$  Current muscle relaxants:  none Current Antihypertensive medications:  none Current Antidepressant medications:  none Current Anticonvulsant medications:  none Current anti-CGRP:  none Current Vitamins/Herbal/Supplements:  none Current Antihistamines/Decongestants:  meclizine '25mg'$  Other therapy:  none Hormone/birth control:  none   HISTORY: H/o of migraines until partial hystecteomy in 2012   In 2016, she was found to have a sebaceous  cyst on top of her head.  She bumped her head over the cyst and developed pain radiating down the right side of her jaw behind her ear and into the right  eye.  Sometimes associated with head tingling, nosebleed and photophobia but no photophobia or nausea.  Would last an hour when treated with a cup of coffee and Tylenol.  They were occurring 3 times a week.  Wakes up with headache.     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  Follow up MRI on 11/13/2016 demonstrated stability of the bone lesions and interval increase size of presumed scalp sebaceous cyst.  MRI of brain with and without contrast on 02/15/2019 showed 19 mm right parietal bone lesion with normal appearance of the brain.     Past NSAIDS/analgesics:  Tylenol Past abortive triptans:  none Past abortive ergotamine:  none Past muscle relaxants:  tizanidine Past anti-emetic:  none Past antihypertensive medications:  none Past antidepressant medications:  none Past anticonvulsant medications:  none Past anti-CGRP:  none Past vitamins/Herbal/Supplements:  none Past antihistamines/decongestants:  none Other past therapies:  none    PAST MEDICAL HISTORY: Past Medical History:  Diagnosis Date   Diabetes mellitus without complication ([place])     MEDICATIONS: Current Outpatient Medications on File Prior to Visit  Medication Sig Dispense Refill   meclizine (ANTIVERT) 25 MG tablet Take 1 tablet (25 mg total) by mouth 3 (three)  times daily as needed for dizziness. 30 tablet 0   Na Sulfate-K Sulfate-Mg Sulf (SUPREP BOWEL PREP KIT) 17.5-3.13-1.6 GM/177ML SOLN Take 1 kit by mouth as directed. For colonoscopy prep 354 mL 0   naproxen (NAPROSYN) 500 MG tablet naproxen 500 mg tablet  TAKE 1 TABLET BY MOUTH 2 TIMES DAILY.     ondansetron (ZOFRAN-ODT) 4 MG disintegrating tablet Take 1 tablet (4 mg total) by mouth every  8 (eight) hours as needed for nausea or vomiting. 15 tablet 0   No current facility-administered medications on file prior to visit.    ALLERGIES: Allergies  Allergen Reactions   Flagyl [Metronidazole] Hives   Peach [Prunus Persica] Hives    FAMILY HISTORY: Family History  Problem Relation Age of Onset   Diabetes Mother    Hypertension Mother    Leukemia Father    Colon cancer Neg Hx    Esophageal cancer Neg Hx    Inflammatory bowel disease Neg Hx    Liver disease Neg Hx    Pancreatic cancer Neg Hx    Rectal cancer Neg Hx    Stomach cancer Neg Hx       Objective:  Blood pressure (!) 142/72, pulse (!) 104, height '5\' 5"'$  (1.651 m), weight 253 lb 11.2 oz (115.1 kg), SpO2 95 %. General: No acute distress.  Patient appears well-groomed.   Head:  Normocephalic/atraumatic Eyes:  Fundi examined but not visualized Neck: supple, no paraspinal tenderness, full range of motion Heart:  Regular rate and rhythm Lungs:  Clear to auscultation bilaterally Back: No paraspinal tenderness Neurological Exam: alert and oriented to person, place, and time.  Speech fluent and not dysarthric, language intact.  CN II-XII intact. Bulk and tone normal, muscle strength 5/5 throughout.  Sensation to light touch intact.  Deep tendon reflexes 2+ throughout, toes downgoing.  Finger to nose testing intact.  Gait normal, Romberg negative.   Metta Clines, DO  CC: Gaynelle Arabian, MD

## 2022-05-16 ENCOUNTER — Ambulatory Visit: Payer: 59 | Admitting: Neurology

## 2022-05-16 ENCOUNTER — Encounter: Payer: Self-pay | Admitting: Neurology

## 2022-05-16 VITALS — BP 142/72 | HR 104 | Ht 65.0 in | Wt 253.7 lb

## 2022-05-16 DIAGNOSIS — G43009 Migraine without aura, not intractable, without status migrainosus: Secondary | ICD-10-CM

## 2022-05-16 MED ORDER — NORTRIPTYLINE HCL 10 MG PO CAPS: 10.0000 mg | ORAL_CAPSULE | Freq: Every day | ORAL | 5 refills | Status: AC

## 2022-05-16 NOTE — Patient Instructions (Signed)
Start nortriptyline '10mg'$  at bedtime.  If no improvement in 4 weeks, contact me and we can increase dose Limit use of pain relievers to no more than 2 days out of week to prevent risk of rebound or medication-overuse headache. Follow up with eye doctor Follow up with me in 4 to 5 months.

## 2022-05-23 ENCOUNTER — Ambulatory Visit (HOSPITAL_BASED_OUTPATIENT_CLINIC_OR_DEPARTMENT_OTHER): Payer: 59 | Admitting: Pulmonary Disease

## 2022-05-31 ENCOUNTER — Other Ambulatory Visit (INDEPENDENT_AMBULATORY_CARE_PROVIDER_SITE_OTHER): Payer: 59

## 2022-05-31 DIAGNOSIS — Z86010 Personal history of colonic polyps: Secondary | ICD-10-CM | POA: Diagnosis not present

## 2022-05-31 DIAGNOSIS — K635 Polyp of colon: Secondary | ICD-10-CM | POA: Diagnosis not present

## 2022-05-31 LAB — CBC
HCT: 43.7 % (ref 36.0–46.0)
Hemoglobin: 14.1 g/dL (ref 12.0–15.0)
MCHC: 32.3 g/dL (ref 30.0–36.0)
MCV: 79 fl (ref 78.0–100.0)
Platelets: 327 10*3/uL (ref 150.0–400.0)
RBC: 5.53 Mil/uL — ABNORMAL HIGH (ref 3.87–5.11)
RDW: 15 % (ref 11.5–15.5)
WBC: 10.5 10*3/uL (ref 4.0–10.5)

## 2022-05-31 LAB — BASIC METABOLIC PANEL
BUN: 15 mg/dL (ref 6–23)
CO2: 26 mEq/L (ref 19–32)
Calcium: 9.1 mg/dL (ref 8.4–10.5)
Chloride: 107 mEq/L (ref 96–112)
Creatinine, Ser: 0.89 mg/dL (ref 0.40–1.20)
GFR: 68.61 mL/min (ref >60.00)
Glucose, Bld: 93 mg/dL (ref 70–99)
Potassium: 4.3 mEq/L (ref 3.5–5.1)
Sodium: 142 mEq/L (ref 135–145)

## 2022-05-31 LAB — PROTIME-INR
INR: 1 ratio (ref 0.8–1.0)
Prothrombin Time: 11.2 s (ref 9.6–13.1)

## 2022-06-01 ENCOUNTER — Telehealth: Payer: Self-pay | Admitting: Gastroenterology

## 2022-06-01 NOTE — Telephone Encounter (Signed)
The pt wanted to make sure that Dr Rush Landmark would be able to proceed with procedures as planned.  He mentioned No contraindications to moving forward on lab result.  We discussed that she is able to move forward and will keep appt as planned.

## 2022-06-01 NOTE — Telephone Encounter (Signed)
Inbound call from pt requesting to speak with a nurse regarding up coming appt at the hospital .Please advise

## 2022-07-04 ENCOUNTER — Encounter (HOSPITAL_COMMUNITY): Payer: Self-pay | Admitting: Gastroenterology

## 2022-07-04 NOTE — Progress Notes (Signed)
Attempted to obtain medical history via telephone, unable to reach at this time. Unable to leave voicemail to return pre surgical testing department's phone call.   ?

## 2022-07-10 NOTE — Anesthesia Preprocedure Evaluation (Signed)
Anesthesia Evaluation  Patient identified by MRN, date of birth, ID band Patient awake    Reviewed: Allergy & Precautions, NPO status , Patient's Chart, lab work & pertinent test results  Airway Mallampati: III  TM Distance: >3 FB Neck ROM: Full    Dental no notable dental hx. (+) Dental Advisory Given   Pulmonary former smoker   Pulmonary exam normal        Cardiovascular negative cardio ROS Normal cardiovascular exam     Neuro/Psych negative neurological ROS     GI/Hepatic negative GI ROS, Neg liver ROS,,,  Endo/Other  diabetes  Morbid obesity  Renal/GU negative Renal ROS     Musculoskeletal negative musculoskeletal ROS (+)    Abdominal   Peds  Hematology negative hematology ROS (+)   Anesthesia Other Findings   Reproductive/Obstetrics                             Anesthesia Physical Anesthesia Plan  ASA: 2  Anesthesia Plan: MAC   Post-op Pain Management: Minimal or no pain anticipated   Induction: Intravenous  PONV Risk Score and Plan: 2 and Midazolam and Propofol infusion  Airway Management Planned: Natural Airway  Additional Equipment:   Intra-op Plan:   Post-operative Plan:   Informed Consent: I have reviewed the patients History and Physical, chart, labs and discussed the procedure including the risks, benefits and alternatives for the proposed anesthesia with the patient or authorized representative who has indicated his/her understanding and acceptance.     Dental advisory given  Plan Discussed with: Anesthesiologist and CRNA  Anesthesia Plan Comments:         Anesthesia Quick Evaluation

## 2022-07-11 ENCOUNTER — Ambulatory Visit (HOSPITAL_BASED_OUTPATIENT_CLINIC_OR_DEPARTMENT_OTHER): Payer: 59 | Admitting: Anesthesiology

## 2022-07-11 ENCOUNTER — Ambulatory Visit (HOSPITAL_COMMUNITY): Payer: 59 | Admitting: Anesthesiology

## 2022-07-11 ENCOUNTER — Other Ambulatory Visit: Payer: Self-pay

## 2022-07-11 ENCOUNTER — Encounter (HOSPITAL_COMMUNITY)
Admission: RE | Disposition: A | Discharge status: Home / Self Care | Payer: Self-pay | Source: Ambulatory Visit | Attending: Gastroenterology

## 2022-07-11 ENCOUNTER — Ambulatory Visit (HOSPITAL_COMMUNITY)
Admission: RE | Admit: 2022-07-11 | Discharge: 2022-07-11 | Disposition: A | Discharge status: Home / Self Care | Payer: 59 | Source: Ambulatory Visit | Attending: Gastroenterology | Admitting: Gastroenterology

## 2022-07-11 ENCOUNTER — Encounter (HOSPITAL_COMMUNITY): Payer: Self-pay | Admitting: Gastroenterology

## 2022-07-11 DIAGNOSIS — K641 Second degree hemorrhoids: Secondary | ICD-10-CM | POA: Insufficient documentation

## 2022-07-11 DIAGNOSIS — Z833 Family history of diabetes mellitus: Secondary | ICD-10-CM | POA: Diagnosis not present

## 2022-07-11 DIAGNOSIS — Z87891 Personal history of nicotine dependence: Secondary | ICD-10-CM | POA: Insufficient documentation

## 2022-07-11 DIAGNOSIS — E119 Type 2 diabetes mellitus without complications: Secondary | ICD-10-CM

## 2022-07-11 DIAGNOSIS — D127 Benign neoplasm of rectosigmoid junction: Secondary | ICD-10-CM | POA: Insufficient documentation

## 2022-07-11 DIAGNOSIS — R7303 Prediabetes: Secondary | ICD-10-CM | POA: Diagnosis not present

## 2022-07-11 DIAGNOSIS — K635 Polyp of colon: Secondary | ICD-10-CM

## 2022-07-11 DIAGNOSIS — Z86010 Personal history of colonic polyps: Secondary | ICD-10-CM

## 2022-07-11 DIAGNOSIS — D490 Neoplasm of unspecified behavior of digestive system: Secondary | ICD-10-CM | POA: Diagnosis not present

## 2022-07-11 DIAGNOSIS — D120 Benign neoplasm of cecum: Secondary | ICD-10-CM | POA: Diagnosis not present

## 2022-07-11 DIAGNOSIS — Z6841 Body Mass Index (BMI) 40.0 and over, adult: Secondary | ICD-10-CM | POA: Insufficient documentation

## 2022-07-11 DIAGNOSIS — K644 Residual hemorrhoidal skin tags: Secondary | ICD-10-CM | POA: Diagnosis not present

## 2022-07-11 HISTORY — PX: ENDOSCOPIC MUCOSAL RESECTION: SHX6839

## 2022-07-11 HISTORY — PX: HEMOSTASIS CLIP PLACEMENT: SHX6857

## 2022-07-11 HISTORY — DX: Sleep apnea, unspecified: G47.30

## 2022-07-11 HISTORY — PX: POLYPECTOMY: SHX5525

## 2022-07-11 HISTORY — PX: COLONOSCOPY WITH PROPOFOL: SHX5780

## 2022-07-11 HISTORY — PX: SUBMUCOSAL LIFTING INJECTION: SHX6855

## 2022-07-11 SURGERY — COLONOSCOPY WITH PROPOFOL
Anesthesia: Monitor Anesthesia Care

## 2022-07-11 MED ORDER — LACTATED RINGERS IV SOLN: INTRAVENOUS | Status: DC

## 2022-07-11 MED ORDER — PROPOFOL 500 MG/50ML IV EMUL
INTRAVENOUS | Status: DC | PRN
  Administered 2022-07-11: 40 mg via INTRAVENOUS
  Administered 2022-07-11: 70 mg via INTRAVENOUS

## 2022-07-11 MED ORDER — PROPOFOL 10 MG/ML IV BOLUS
INTRAVENOUS | Status: DC | PRN
  Administered 2022-07-11: 80 ug/kg/min via INTRAVENOUS

## 2022-07-11 MED ORDER — NAPROXEN 500 MG PO TABS: 500.0000 mg | ORAL_TABLET | Freq: Two times a day (BID) | ORAL | Status: AC | PRN

## 2022-07-11 MED ORDER — PROPOFOL 500 MG/50ML IV EMUL
INTRAVENOUS | Status: AC
  Filled 2022-07-11: qty 100

## 2022-07-11 MED ORDER — SODIUM CHLORIDE 0.9 % IV SOLN: INTRAVENOUS | Status: DC

## 2022-07-11 MED ORDER — LIDOCAINE HCL (CARDIAC) PF 100 MG/5ML IV SOSY
PREFILLED_SYRINGE | INTRAVENOUS | Status: DC | PRN
  Administered 2022-07-11: 60 mg via INTRAVENOUS

## 2022-07-11 SURGICAL SUPPLY — 22 items

## 2022-07-11 NOTE — Op Note (Signed)
[facility] Patient Name: Michaela Alvarado Procedure Date: 07/11/2022 MRN: [id number] Attending MD: Corliss Parish , MD, [id number] Date of Birth: 05/24/1955 CSN: [id number] Age: 67 Admit Type: Outpatient Procedure:                Colonoscopy Indications:              Excision of colonic polyp Providers:                Corliss Parish, MD, Lorenza Evangelist, RN,                            Kandice Robinsons, Technician, Norman Clay, RN Referring MD:              Medicines:                Monitored Anesthesia Care Complications:            No immediate complications. Estimated Blood Loss:     Estimated blood loss was minimal. Procedure:                Pre-Anesthesia Assessment:                           - Prior to the procedure, a History and Physical                            was performed, and patient medications and                            allergies were reviewed. The patient's tolerance of                            previous anesthesia was also reviewed. The risks                            and benefits of the procedure and the sedation                            options and risks were discussed with the patient.                            All questions were answered, and informed consent                            was obtained. Prior Anticoagulants: The patient has                            taken no anticoagulant or antiplatelet agents                            except for NSAID medication. ASA Grade Assessment:                            III - A patient with severe systemic disease. After  reviewing the risks and benefits, the patient was                            deemed in satisfactory condition to undergo the                            procedure.                           After obtaining informed consent, the colonoscope                            was passed under direct vision. Throughout the                             procedure, the patient's blood pressure, pulse, and                            oxygen saturations were monitored continuously. The                            CF-HQ190L ([id number]) Olympus colonoscope was                            introduced through the anus and advanced to the 3                            cm into the ileum. The colonoscopy was performed                            without difficulty. The patient tolerated the                            procedure. The quality of the bowel preparation was                            good. The terminal ileum, ileocecal valve,                            appendiceal orifice, and rectum were photographed. Scope In: 7:51:15 AM Scope Out: 8:42:12 AM Scope Withdrawal Time: 0 hours 47 minutes 42 seconds  Total Procedure Duration: 0 hours 50 minutes 57 seconds  Findings:      The digital rectal exam findings include hemorrhoids. Pertinent       negatives include no palpable rectal lesions.      The terminal ileum and ileocecal valve appeared normal.      A 25 x 30 mm polypoid lesion was found in the cecum. The lesion was       mixed lateral spreading. No bleeding was present. Preparations were made       for attempt at mucosal resection. Demarcation of the lesion was       performed with high-definition white light and narrow band imaging to       clearly identify the boundaries of the lesion. EverLift was injected to       raise  the lesion. Piecemeal mucosal resection using a snare was       performed. Resection and retrieval were complete. Resected tissue       margins were examined and clear of polyp tissue. Fulguration to ablate       the lesion margin by snare tip soft coagulation was successful. To       prevent bleeding after mucosal resection, four hemostatic clips were       successfully placed (MR conditional). Clip manufacturer: [employer]. There was no bleeding at the end of the procedure.      A 3 mm polyp was found in the  recto-sigmoid colon. The polyp was       sessile. The polyp was removed with a cold snare. Resection and       retrieval were complete.      Normal mucosa was found in the entire colon otherwise.      Non-bleeding non-thrombosed external and internal hemorrhoids were found       during retroflexion, during perianal exam and during digital exam. The       hemorrhoids were Grade II (internal hemorrhoids that prolapse but reduce       spontaneously). Impression:               - Hemorrhoids found on digital rectal exam.                           - The examined portion of the ileum was normal.                           - Polypoid lesion in the cecum. Complete removal                            was accomplished via piecemeal EMR. Treated with                            STSC. Clips (MR conditional) were placed. Clip                            manufacturer: [employer].                           - One 3 mm polyp at the recto-sigmoid colon,                            removed with a cold snare. Resected and retrieved.                           - Normal mucosa in the entire examined colon                            otherwise.                           - Non-bleeding non-thrombosed external and internal                            hemorrhoids. Moderate Sedation:  Not Applicable - Patient had care per Anesthesia. Recommendation:           - The patient will be observed post-procedure,                            until all discharge criteria are met.                           - Discharge patient to home.                           - Patient has a contact number available for                            emergencies. The signs and symptoms of potential                            delayed complications were discussed with the                            patient. Return to normal activities tomorrow.                            Written discharge instructions were provided to the                             patient.                           - High fiber diet.                           - Use FiberCon 1-2 tablets PO daily.                           - No aspirin, ibuprofen, naproxen, or other                            non-steroidal anti-inflammatory drugs for 1 week                            after polyp removal.                           - Continue present medications.                           - Await pathology results.                           - Repeat colonoscopy in 6 to 9 months for                            surveillance after piecemeal polypectomy.                           - The  findings and recommendations were discussed                            with the patient.                           - The findings and recommendations were discussed                            with the patient's family. Procedure Code(s):        --- Professional ---                           [phone], Colonoscopy, flexible; with endoscopic                            mucosal resection                           [id number], 59, Colonoscopy, flexible; with removal of                            tumor(s), polyp(s), or other lesion(s) by snare                            technique Diagnosis Code(s):        --- Professional ---                           K64.1, Second degree hemorrhoids                           D49.0, Neoplasm of unspecified behavior of                            digestive system                           D12.7, Benign neoplasm of rectosigmoid junction                           K63.5, Polyp of colon CPT copyright 2022 American Medical Association. All rights reserved. The codes documented in this report are preliminary and upon coder review may  be revised to meet current compliance requirements. Corliss Parish, MD 07/11/2022 8:58:50 AM Number of Addenda: 0

## 2022-07-11 NOTE — Transfer of Care (Signed)
Immediate Anesthesia Transfer of Care Note  Patient: Fahima Cifelli Smith-Robertson  Procedure(s) Performed: COLONOSCOPY WITH PROPOFOL ENDOSCOPIC MUCOSAL RESECTION HEMOSTASIS CLIP PLACEMENT POLYPECTOMY SUBMUCOSAL LIFTING INJECTION  Patient Location: PACU and Endoscopy Unit  Anesthesia Type:MAC  Level of Consciousness: awake, alert , oriented, and patient cooperative  Airway & Oxygen Therapy: Patient Spontanous Breathing and Patient connected to face mask oxygen  Post-op Assessment: Report given to RN, Post -op Vital signs reviewed and stable, and Patient moving all extremities  Post vital signs: Reviewed and stable  Last Vitals:  Vitals Value Taken Time  BP 115/74 07/11/22 0850  Temp 36.1 C 07/11/22 0850  Pulse 72 07/11/22 0850  Resp 22 07/11/22 0850  SpO2      Last Pain:  Vitals:   07/11/22 0850  TempSrc: Temporal  PainSc:          Complications: No notable events documented.

## 2022-07-11 NOTE — Anesthesia Postprocedure Evaluation (Signed)
Anesthesia Post Note  Patient: Michaela Alvarado  Procedure(s) Performed: COLONOSCOPY WITH PROPOFOL ENDOSCOPIC MUCOSAL RESECTION HEMOSTASIS CLIP PLACEMENT POLYPECTOMY SUBMUCOSAL LIFTING INJECTION     Patient location during evaluation: Endoscopy Anesthesia Type: MAC Level of consciousness: awake and alert Pain management: pain level controlled Vital Signs Assessment: post-procedure vital signs reviewed and stable Respiratory status: spontaneous breathing and respiratory function stable Cardiovascular status: stable Postop Assessment: no apparent nausea or vomiting Anesthetic complications: no   No notable events documented.  Last Vitals:  Vitals:   07/11/22 0855 07/11/22 0900  BP: 121/63 (!) 128/93  Pulse: 76 73  Resp: 18 16  Temp:    SpO2: 97% 96%    Last Pain:  Vitals:   07/11/22 0910  TempSrc:   PainSc: 0-No pain                 Amberli Ruegg DANIEL

## 2022-07-11 NOTE — H&P (Signed)
GASTROENTEROLOGY PROCEDURE H&P NOTE   Primary Care Physician: Blair Heys, MD  HPI: Michaela Alvarado is a 67 y.o. female who presents for Colonoscopy for attempt at EMR of Cecal polyp.  Past Medical History:  Diagnosis Date   Diabetes mellitus without complication    PRE DIABETIC   Sleep apnea    Past Surgical History:  Procedure Laterality Date   PARTIAL HYSTERECTOMY N/A 03/19/2010   UTERINE FIBROID SURGERY     Current Facility-Administered Medications  Medication Dose Route Frequency Provider Last Rate Last Admin   0.9 %  sodium chloride infusion   Intravenous Continuous Mansouraty, Netty Starring., MD       lactated ringers infusion   Intravenous Continuous Mansouraty, Netty Starring., MD 50 mL/hr at 07/11/22 0652 New Bag at 07/11/22 9629    Current Facility-Administered Medications:    0.9 %  sodium chloride infusion, , Intravenous, Continuous, Mansouraty, Netty Starring., MD   lactated ringers infusion, , Intravenous, Continuous, Mansouraty, Netty Starring., MD, Last Rate: 50 mL/hr at 07/11/22 0652, New Bag at 07/11/22 5284 Allergies  Allergen Reactions   Flagyl [Metronidazole] Hives   Peach [Prunus Persica] Hives    Fruit with fine hair   Family History  Problem Relation Age of Onset   Diabetes Mother    Hypertension Mother    Leukemia Father    Colon cancer Neg Hx    Esophageal cancer Neg Hx    Inflammatory bowel disease Neg Hx    Liver disease Neg Hx    Pancreatic cancer Neg Hx    Rectal cancer Neg Hx    Stomach cancer Neg Hx    Social History   Socioeconomic History   Marital status: Married    Spouse name: Not on file   Number of children: 2   Years of education: Not on file   Highest education level: Not on file  Occupational History   Not on file  Tobacco Use   Smoking status: Former    Packs/day: 0.25    Years: 10.00    Additional pack years: 0.00    Total pack years: 2.50    Types: Cigarettes    Start date: 03/19/1984    Quit date: 03/20/1995     Years since quitting: 27.3   Smokeless tobacco: Never  Vaping Use   Vaping Use: Never used  Substance and Sexual Activity   Alcohol use: Never   Drug use: Never   Sexual activity: Not on file  Other Topics Concern   Not on file  Social History Narrative   Right Handed   Lives in two story home   Drinks 1-2 cups of coffee daily   Social Determinants of Health   Financial Resource Strain: Not on file  Food Insecurity: Not on file  Transportation Needs: Not on file  Physical Activity: Not on file  Stress: Not on file  Social Connections: Not on file  Intimate Partner Violence: Not on file    Physical Exam: Today's Vitals   07/11/22 0636  BP: (!) 160/80  Pulse: 87  Resp: (!) 22  Temp: 98.4 F (36.9 C)  TempSrc: Temporal  SpO2: 98%  Weight: 111.1 kg  Height:  (1.651 m)  PainSc: 0-No pain   Body mass index is 40.77 kg/m. GEN: NAD EYE: Sclerae anicteric ENT: MMM CV: Non-tachycardic GI: Soft, NT/ND NEURO:  Alert & Oriented x 3  Lab Results: No results for input(s): "WBC", "HGB", "HCT", "PLT" in the last 72 hours. BMET No results  for input(s): "NA", "K", "CL", "CO2", "GLUCOSE", "BUN", "CREATININE", "CALCIUM" in the last 72 hours. LFT No results for input(s): "PROT", "ALBUMIN", "AST", "ALT", "ALKPHOS", "BILITOT", "BILIDIR", "IBILI" in the last 72 hours. PT/INR No results for input(s): "LABPROT", "INR" in the last 72 hours.   Impression / Plan: This is a 67 y.o.female who presents for Colonoscopy for attempt at EMR of Cecal polyp.  The risks and benefits of endoscopic evaluation/treatment were discussed with the patient and/or family; these include but are not limited to the risk of perforation, infection, bleeding, missed lesions, lack of diagnosis, severe illness requiring hospitalization, as well as anesthesia and sedation related illnesses.  The patient's history has been reviewed, patient examined, no change in status, and deemed stable for procedure.   The patient and/or family is agreeable to proceed.    Corliss Parish, MD [facility] Advanced Endoscopy Office # [id number]

## 2022-07-11 NOTE — Discharge Instructions (Signed)

## 2022-07-12 ENCOUNTER — Encounter: Payer: Self-pay | Admitting: Gastroenterology

## 2022-07-12 LAB — SURGICAL PATHOLOGY

## 2022-07-15 ENCOUNTER — Encounter (HOSPITAL_COMMUNITY): Payer: Self-pay | Admitting: Gastroenterology

## 2022-10-11 NOTE — Progress Notes (Signed)
NEUROLOGY FOLLOW UP OFFICE NOTE  Michaela Alvarado [id number]  Assessment/Plan:   Migraine without aura, without status migrainosus, not intractable     1.  Continue to monitor off nortriptyline.  If headaches return more frequently, then will restart. 2.  Limit use of pain relievers to no more than 2 days out of week to prevent risk of rebound or medication-overuse headache. 3.  Follow up 6 months.     Subjective:  Michaela Alvarado is a 67 year old right-handed female with diabetes and nodular goiter biopsied in 02/2019 with negative pathology who follows up for headache.  UPDATE: Started nortriptyline.  It was helpful.  Headaches tapered off.  Eventually she no longer had any headaches and took herself off about a month ago.  No headaches until today.  She has been taking care of her grandchildren and their dog.       Current NSAIDS/analgesics:  Naproxen 500mg  Current triptans:  none Current ergotamine:  none Current anti-emetic:  Zofran ODT 4mg  Current muscle relaxants:  none Current Antihypertensive medications:  none Current Antidepressant medications:  nortriptyline 10mg  at bedtime Current Anticonvulsant medications:  none Current anti-CGRP:  none Current Vitamins/Herbal/Supplements:  none Current Antihistamines/Decongestants:  meclizine 25mg  Other therapy:  none Hormone/birth control:  none   HISTORY: H/o of migraines until partial hystecteomy in 2012   In 2016, she was found to have a sebaceous  cyst on top of her head.  She bumped her head over the cyst and developed pain radiating down the right side of her jaw behind her ear and into the right eye.  Sometimes associated with head tingling, nosebleed and photophobia but no photophobia or nausea.  Would last an hour when treated with a cup of coffee and Tylenol.  They were occurring 3 times a week.  Wakes up with headache.     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  Follow up MRI on 11/13/2016 demonstrated stability of the bone lesions and interval increase size of presumed scalp sebaceous cyst.  MRI of brain with and without contrast on 02/15/2019 showed 19 mm right parietal bone lesion with normal appearance of the brain.     Past NSAIDS/analgesics:  Tylenol Past abortive triptans:  none Past abortive ergotamine:  none Past muscle relaxants:  tizanidine Past anti-emetic:  none Past antihypertensive medications:  none Past antidepressant medications:  none Past anticonvulsant medications:  none Past anti-CGRP:  none Past vitamins/Herbal/Supplements:  none Past antihistamines/decongestants:  none Other past therapies:  none    PAST MEDICAL HISTORY: Past Medical History:  Diagnosis Date   Diabetes mellitus without complication (HCC)    PRE DIABETIC   Sleep apnea     MEDICATIONS: Current Outpatient Medications on File Prior to Visit  Medication Sig Dispense Refill   meclizine (ANTIVERT) 25 MG tablet Take 1 tablet (25 mg total) by mouth 3 (three) times daily as needed for dizziness. 30 tablet 0   Multiple Vitamins-Minerals (HAIR SKIN & NAILS PO) Take 2 tablets by mouth daily.     naproxen (NAPROSYN) 500 MG tablet Take 1 tablet (500 mg total) by mouth 2 (two) times daily as needed for moderate pain or mild pain.     nortriptyline (PAMELOR) 10 MG  capsule Take 1 capsule (10 mg total) by mouth at bedtime. (Patient not taking: Reported on 07/09/2022) 30 capsule 5   ondansetron (ZOFRAN-ODT) 4 MG disintegrating tablet Take 1 tablet (4 mg total) by mouth every 8 (eight) hours as needed for nausea or vomiting. (Patient not taking: Reported on 07/09/2022) 15 tablet 0   Polyethyl Glycol-Propyl Glycol (SYSTANE)  0.4-0.3 % SOLN Place 1 drop into both eyes daily as needed (Dry eyes).     Turmeric (QC TUMERIC COMPLEX PO) Take 2,000 mg by mouth daily. 1000 mg each     No current facility-administered medications on file prior to visit.    ALLERGIES: Allergies  Allergen Reactions   Flagyl [Metronidazole] Hives   Peach [Prunus Persica] Hives    Fruit with fine hair    FAMILY HISTORY: Family History  Problem Relation Age of Onset   Diabetes Mother    Hypertension Mother    Leukemia Father    Colon cancer Neg Hx    Esophageal cancer Neg Hx    Inflammatory bowel disease Neg Hx    Liver disease Neg Hx    Pancreatic cancer Neg Hx    Rectal cancer Neg Hx    Stomach cancer Neg Hx       Objective:  Blood pressure 131/85, pulse 93, height 5\' 5"  (1.651 m), weight 253 lb 6.4 oz (114.9 kg), SpO2 98%. General: No acute distress.  Patient appears well-groomed.   Head:  Normocephalic/atraumatic     Shon Millet, DO  CC: Blair Heys, MD

## 2022-10-15 ENCOUNTER — Encounter: Payer: Self-pay | Admitting: Neurology

## 2022-10-15 ENCOUNTER — Ambulatory Visit (INDEPENDENT_AMBULATORY_CARE_PROVIDER_SITE_OTHER): Payer: 59 | Admitting: Neurology

## 2022-10-15 VITALS — BP 131/85 | HR 93 | Ht 65.0 in | Wt 253.4 lb

## 2022-10-15 DIAGNOSIS — G43009 Migraine without aura, not intractable, without status migrainosus: Secondary | ICD-10-CM | POA: Diagnosis not present

## 2022-11-13 ENCOUNTER — Other Ambulatory Visit: Payer: Self-pay | Admitting: Family Medicine

## 2022-11-13 DIAGNOSIS — Z0000 Encounter for general adult medical examination without abnormal findings: Secondary | ICD-10-CM

## 2023-01-07 ENCOUNTER — Ambulatory Visit
Admission: RE | Admit: 2023-01-07 | Discharge: 2023-01-07 | Disposition: A | Discharge status: Home / Self Care | Payer: 59 | Source: Ambulatory Visit | Attending: Family Medicine | Admitting: Family Medicine

## 2023-01-07 DIAGNOSIS — Z0000 Encounter for general adult medical examination without abnormal findings: Secondary | ICD-10-CM

## 2023-03-26 ENCOUNTER — Ambulatory Visit
Admission: RE | Admit: 2023-03-26 | Discharge: 2023-03-26 | Disposition: A | Discharge status: Home / Self Care | Payer: Medicare Other | Source: Ambulatory Visit | Attending: Family Medicine | Admitting: Family Medicine

## 2023-03-26 VITALS — BP 144/85 | HR 72 | Temp 98.0°F | Resp 19

## 2023-03-26 DIAGNOSIS — J22 Unspecified acute lower respiratory infection: Secondary | ICD-10-CM | POA: Diagnosis not present

## 2023-03-26 MED ORDER — FLUTICASONE PROPIONATE 50 MCG/ACT NA SUSP
1.0000 | Freq: Two times a day (BID) | NASAL | 0 refills | Status: DC | PRN
Start: 2023-03-26 — End: 2023-03-27

## 2023-03-26 MED ORDER — AMOXICILLIN-POT CLAVULANATE 875-125 MG PO TABS
1.0000 | ORAL_TABLET | Freq: Two times a day (BID) | ORAL | 0 refills | Status: AC
Start: 2023-03-26 — End: 2023-04-05

## 2023-03-26 NOTE — ED Provider Notes (Signed)
 [facility]    CSN: [id number] Arrival date & time: 03/26/23  9047      History   Chief Complaint Chief Complaint  Patient presents with   Cough    congestioncoughing for 2 weeks - Entered by patient   Otalgia    HPI Michaela Alvarado is a 68 y.o. female.  Patient presents today with a 3-week history of cough, congestion, postnasal drainage and throat irritation.  She has been taken multiple over-the-counter medications without any improvement of symptoms.  Also reports some left ear pain since onset of progress.  She has had some intermittent wheezing but has no history of chronic bronchitis or asthma.  Denies fever or any flulike symptoms.   Past Medical History:  Diagnosis Date   Diabetes mellitus without complication (HCC)    PRE DIABETIC   Sleep apnea     Patient Active Problem List   Diagnosis Date Noted   Cecal polyp 05/13/2022   Hx of adenomatous colonic polyps 05/13/2022    Past Surgical History:  Procedure Laterality Date   COLONOSCOPY WITH PROPOFOL  N/A 07/11/2022   Procedure: COLONOSCOPY WITH PROPOFOL ;  Surgeon: Wilhelmenia Aloha Raddle., MD;  Location: [facility];  Service: Gastroenterology;  Laterality: N/A;   ENDOSCOPIC MUCOSAL RESECTION N/A 07/11/2022   Procedure: ENDOSCOPIC MUCOSAL RESECTION;  Surgeon: Wilhelmenia Aloha Raddle., MD;  Location: [facility];  Service: Gastroenterology;  Laterality: N/A;   HEMOSTASIS CLIP PLACEMENT  07/11/2022   Procedure: HEMOSTASIS CLIP PLACEMENT;  Surgeon: Wilhelmenia Aloha Raddle., MD;  Location: [facility];  Service: Gastroenterology;;   PARTIAL HYSTERECTOMY N/A 03/19/2010   POLYPECTOMY  07/11/2022   Procedure: POLYPECTOMY;  Surgeon: Wilhelmenia Aloha Raddle., MD;  Location: [facility];  Service: Gastroenterology;;   ROBLEY LIFTING INJECTION  07/11/2022   Procedure: SUBMUCOSAL LIFTING INJECTION;  Surgeon: Wilhelmenia Aloha Raddle., MD;  Location: [facility];  Service: Gastroenterology;;   UTERINE  FIBROID SURGERY      OB History   No obstetric history on file.      Home Medications    Prior to Admission medications   Medication Sig Start Date End Date Taking? Authorizing Provider  amoxicillin -clavulanate (AUGMENTIN ) 875-125 MG tablet Take 1 tablet by mouth every 12 (twelve) hours for 10 days. 03/26/23 04/05/23 Yes Arloa Suzen RAMAN, NP  fluticasone  (FLONASE ) 50 MCG/ACT nasal spray Place 1 spray into both nostrils 2 (two) times daily as needed for allergies or rhinitis. 03/26/23  Yes Arloa Suzen RAMAN, NP  Multiple Vitamins-Minerals (HAIR SKIN & NAILS PO) Take 2 tablets by mouth daily.   Yes [provider]  Turmeric (QC TUMERIC COMPLEX PO) Take 2,000 mg by mouth daily. 1000 mg each   Yes [provider]  meclizine  (ANTIVERT ) 25 MG tablet Take 1 tablet (25 mg total) by mouth 3 (three) times daily as needed for dizziness. 11/08/21   Rolinda Rogue, MD  naproxen  (NAPROSYN ) 500 MG tablet Take 1 tablet (500 mg total) by mouth 2 (two) times daily as needed for moderate pain or mild pain. 07/18/22   Mansouraty, Aloha Raddle., MD  nortriptyline  (PAMELOR ) 10 MG capsule Take 1 capsule (10 mg total) by mouth at bedtime. 05/16/22   Skeet Juliene SAUNDERS, DO  ondansetron  (ZOFRAN -ODT) 4 MG disintegrating tablet Take 1 tablet (4 mg total) by mouth every 8 (eight) hours as needed for nausea or vomiting. 11/08/21   Rolinda Rogue, MD  Polyethyl Glycol-Propyl Glycol (SYSTANE) 0.4-0.3 % SOLN Place 1 drop into both eyes daily as needed (Dry eyes).    [provider]    Family History Family History  Problem Relation Age of Onset   Diabetes Mother    Hypertension Mother    Leukemia Father    Colon cancer Neg Hx    Esophageal cancer Neg Hx    Inflammatory bowel disease Neg Hx    Liver disease Neg Hx    Pancreatic cancer Neg Hx    Rectal cancer Neg Hx    Stomach cancer Neg Hx     Social History Social History   Tobacco Use   Smoking status: Former    Current packs/day: 0.00     Average packs/day: 0.3 packs/day for 11.0 years (2.8 ttl pk-yrs)    Types: Cigarettes    Start date: 03/19/1984    Quit date: 03/20/1995    Years since quitting: 28.0   Smokeless tobacco: Never  Vaping Use   Vaping status: Never Used  Substance Use Topics   Alcohol use: Never   Drug use: Never     Allergies   Flagyl [metronidazole] and Peach [prunus persica]   Review of Systems Review of Systems  Constitutional:  Negative for chills and fever.  HENT:  Positive for ear pain. Negative for sore throat.   Eyes:  Negative for pain and visual disturbance.  Respiratory:  Positive for cough. Negative for shortness of breath.   Cardiovascular:  Negative for chest pain and palpitations.  Gastrointestinal:  Negative for abdominal pain and vomiting.  Genitourinary:  Negative for dysuria and hematuria.  Musculoskeletal:  Negative for arthralgias and back pain.  Skin:  Negative for color change and rash.  Neurological:  Negative for seizures and syncope.  All other systems reviewed and are negative.    Physical Exam Triage Vital Signs ED Triage Vitals  Encounter Vitals Group     BP 03/26/23 1023 (!) 144/85     Systolic BP Percentile --      Diastolic BP Percentile --      Pulse Rate 03/26/23 1023 72     Resp 03/26/23 1023 19     Temp 03/26/23 1023 98 F (36.7 C)     Temp Source 03/26/23 1023 Oral     SpO2 03/26/23 1023 94 %     Weight --      Height --      Head Circumference --      Peak Flow --      Pain Score 03/26/23 1020 6     Pain Loc --      Pain Education --      Exclude from Growth Chart --    No data found.  Updated Vital Signs BP (!) 144/85 (BP Location: Right Arm)   Pulse 72   Temp 98 F (36.7 C) (Oral)   Resp 19   SpO2 94%   Visual Acuity Right Eye Distance:   Left Eye Distance:   Bilateral Distance:    Right Eye Near:   Left Eye Near:    Bilateral Near:     Physical Exam Constitutional:      Appearance: She is well-developed. She is  ill-appearing.  HENT:     Head: Normocephalic and atraumatic.     Right Ear: Tympanic membrane and ear canal normal.     Left Ear: Tympanic membrane and ear canal normal.     Nose: Mucosal edema, congestion and rhinorrhea present. Rhinorrhea is purulent.     Mouth/Throat:     Mouth: No oral lesions.     Pharynx: Uvula midline. Postnasal  drip present. No pharyngeal swelling, oropharyngeal exudate, posterior oropharyngeal erythema or uvula swelling.     Tonsils: No tonsillar exudate or tonsillar abscesses. 0 on the right. 0 on the left.  Eyes:     Extraocular Movements: Extraocular movements intact.     Conjunctiva/sclera: Conjunctivae normal.     Pupils: Pupils are equal, round, and reactive to light.  Neck:     Vascular: No carotid bruit.  Cardiovascular:     Rate and Rhythm: Regular rhythm. Tachycardia present.  Pulmonary:     Effort: Pulmonary effort is normal.     Breath sounds: Normal breath sounds. Decreased air movement present.  Musculoskeletal:        General: Normal range of motion.     Cervical back: Normal range of motion and neck supple.  Lymphadenopathy:     Cervical: No cervical adenopathy.  Skin:    General: Skin is warm and dry.     Capillary Refill: Capillary refill takes less than 2 seconds.  Neurological:     General: No focal deficit present.     Mental Status: She is alert and oriented to person, place, and time.  Psychiatric:        Mood and Affect: Mood normal.        Behavior: Behavior normal.      UC Treatments / Results  Labs (all labs ordered are listed, but only abnormal results are displayed) Labs Reviewed - No data to display  EKG   Radiology No results found.  Procedures Procedures (including critical care time)  Medications Ordered in UC Medications - No data to display  Initial Impression / Assessment and Plan / UC Course  I have reviewed the triage vital signs and the nursing notes.  Pertinent labs & imaging results that  were available during my care of the patient were reviewed by me and considered in my medical decision making (see chart for details).    Given duration of symptoms suspect illness is bacterial in etiology and treating for lower respiratory infection.  Return precautions given if symptoms worsen or do not improve.  1. Lower respiratory infection (e.g., bronchitis, pneumonia, pneumonitis, pulmonitis) (Primary) - amoxicillin -clavulanate (AUGMENTIN ) 875-125 MG tablet; Take 1 tablet by mouth every 12 (twelve) hours for 10 days.  Dispense: 20 tablet; Refill: 0 - fluticasone  (FLONASE ) 50 MCG/ACT nasal spray; Place 1 spray into both nostrils 2 (two) times daily as needed for allergies or rhinitis.  Dispense: 9.9 mL; Refill: 0    Final Clinical Impressions(s) / UC Diagnoses   Final diagnoses:  Lower respiratory infection (e.g., bronchitis, pneumonia, pneumonitis, pulmonitis)   Discharge Instructions   None    ED Prescriptions     Medication Sig Dispense Auth. Provider   amoxicillin -clavulanate (AUGMENTIN ) 875-125 MG tablet Take 1 tablet by mouth every 12 (twelve) hours for 10 days. 20 tablet Lizania Bouchard S, NP   fluticasone  (FLONASE ) 50 MCG/ACT nasal spray Place 1 spray into both nostrils 2 (two) times daily as needed for allergies or rhinitis. 9.9 mL Arloa Suzen RAMAN, NP      PDMP not reviewed this encounter.   Arloa Suzen RAMAN, NP 03/26/23 1231

## 2023-03-26 NOTE — ED Triage Notes (Signed)
 Sx x 3 weeks  Productive cough with yellow mucus Nasal congestion Left ear pain

## 2023-03-27 ENCOUNTER — Telehealth: Payer: Self-pay

## 2023-03-27 DIAGNOSIS — J22 Unspecified acute lower respiratory infection: Secondary | ICD-10-CM

## 2023-03-27 MED ORDER — FLUTICASONE PROPIONATE 50 MCG/ACT NA SUSP: 1.0000 | Freq: Two times a day (BID) | NASAL | 0 refills | Status: AC | PRN

## 2023-03-27 NOTE — Telephone Encounter (Signed)
 Initial Call (to patient access):  I have a pt here, mrn [phone] L. Casimir needs her nasal spray rx changed to [facility] on [address]   Response:  Patient left before being able to speak to her. I have left message with original pharmacy cancelling Flonase  Rx and resent to requested pharmacy.SABRA WENDI Dixon CMA

## 2023-04-06 ENCOUNTER — Other Ambulatory Visit: Payer: Self-pay

## 2023-04-06 ENCOUNTER — Ambulatory Visit
Admission: RE | Admit: 2023-04-06 | Discharge: 2023-04-06 | Disposition: A | Discharge status: Home / Self Care | Payer: Self-pay | Source: Ambulatory Visit | Attending: Physician Assistant | Admitting: Physician Assistant

## 2023-04-06 VITALS — BP 151/81 | HR 96 | Temp 98.3°F | Resp 18

## 2023-04-06 DIAGNOSIS — T361X5A Adverse effect of cephalosporins and other beta-lactam antibiotics, initial encounter: Secondary | ICD-10-CM

## 2023-04-06 DIAGNOSIS — T360X5A Adverse effect of penicillins, initial encounter: Secondary | ICD-10-CM | POA: Diagnosis not present

## 2023-04-06 DIAGNOSIS — B379 Candidiasis, unspecified: Secondary | ICD-10-CM

## 2023-04-06 DIAGNOSIS — T3695XA Adverse effect of unspecified systemic antibiotic, initial encounter: Secondary | ICD-10-CM

## 2023-04-06 DIAGNOSIS — R197 Diarrhea, unspecified: Secondary | ICD-10-CM | POA: Diagnosis not present

## 2023-04-06 MED ORDER — FLUCONAZOLE 150 MG PO TABS: 150.0000 mg | ORAL_TABLET | ORAL | 0 refills | Status: AC | PRN

## 2023-04-06 NOTE — ED Provider Notes (Signed)
[name]    CSN: [id number] Arrival date & time: 04/06/23  1004      History   Chief Complaint Chief Complaint  Patient presents with   Diarrhea    Medicine not working causing diarrhea and a rash and irration - Entered by patient    HPI Michaela Alvarado is a 68 y.o. female.   Patient presents today for evaluation of medication adverse reactions.  Reports that on 03/26/2023 she was seen and diagnosed with a lower respiratory infection.  She was started on Augmentin and fluticasone nasal spray.  She has not yet started the fluticasone nasal spray but did take 1 week of the Augmentin (was prescribed 10 days) and developed severe diarrhea.  Her last dose was approximately 3 days ago and since then the diarrhea has resolved; she was able to eat normally this morning without symptoms.  She denies any abdominal pain, fever, nausea, vomiting.  Denies any medication changes, recent travel, suspicious food intake.  She has not been using any over-the-counter medications for symptom management.  Additionally, she has noticed a several day history of vaginal irritation.  She describes this as thick white vaginal discharge with associated pruritus and irritation.  Denies any additional medication changes.  She has not tried any over-the-counter medication for symptom management.  She denies any fever, nausea, vomiting, pelvic pain.    Past Medical History:  Diagnosis Date   Diabetes mellitus without complication (HCC)    PRE DIABETIC   Sleep apnea     Patient Active Problem List   Diagnosis Date Noted   Cecal polyp 05/13/2022   Hx of adenomatous colonic polyps 05/13/2022    Past Surgical History:  Procedure Laterality Date   COLONOSCOPY WITH PROPOFOL N/A 07/11/2022   Procedure: COLONOSCOPY WITH PROPOFOL;  Surgeon: Lemar Lofty., MD;  Location: [facility];  Service: Gastroenterology;  Laterality: N/A;   ENDOSCOPIC MUCOSAL RESECTION N/A 07/11/2022    Procedure: ENDOSCOPIC MUCOSAL RESECTION;  Surgeon: Meridee Score Netty Starring., MD;  Location: [facility];  Service: Gastroenterology;  Laterality: N/A;   HEMOSTASIS CLIP PLACEMENT  07/11/2022   Procedure: HEMOSTASIS CLIP PLACEMENT;  Surgeon: Lemar Lofty., MD;  Location: [facility];  Service: Gastroenterology;;   PARTIAL HYSTERECTOMY N/A 03/19/2010   POLYPECTOMY  07/11/2022   Procedure: POLYPECTOMY;  Surgeon: Lemar Lofty., MD;  Location: [facility];  Service: Gastroenterology;;   Sunnie Nielsen LIFTING INJECTION  07/11/2022   Procedure: SUBMUCOSAL LIFTING INJECTION;  Surgeon: Lemar Lofty., MD;  Location: [facility];  Service: Gastroenterology;;   UTERINE FIBROID SURGERY      OB History   No obstetric history on file.      Home Medications    Prior to Admission medications   Medication Sig Start Date End Date Taking? Authorizing Provider  fluconazole (DIFLUCAN) 150 MG tablet Take 1 tablet (150 mg total) by mouth every 3 (three) days as needed for up to 2 doses. 04/06/23  Yes Cordarius Benning K, PA-C  fluticasone (FLONASE) 50 MCG/ACT nasal spray Place 1 spray into both nostrils 2 (two) times daily as needed for allergies or rhinitis. 03/27/23   Bing Neighbors, NP  meclizine (ANTIVERT) 25 MG tablet Take 1 tablet (25 mg total) by mouth 3 (three) times daily as needed for dizziness. 11/08/21   Mardella Layman, MD  Multiple Vitamins-Minerals (HAIR SKIN & NAILS PO) Take 2 tablets by mouth daily.    [provider]  naproxen (NAPROSYN) 500 MG tablet Take 1 tablet (500 mg total)  by mouth 2 (two) times daily as needed for moderate pain or mild pain. 07/18/22   Mansouraty, Netty Starring., MD  nortriptyline (PAMELOR) 10 MG capsule Take 1 capsule (10 mg total) by mouth at bedtime. 05/16/22   Drema Dallas, DO  ondansetron (ZOFRAN-ODT) 4 MG disintegrating tablet Take 1 tablet (4 mg total) by mouth every 8 (eight) hours as needed for nausea or vomiting. 11/08/21   Mardella Layman,  MD  Polyethyl Glycol-Propyl Glycol (SYSTANE) 0.4-0.3 % SOLN Place 1 drop into both eyes daily as needed (Dry eyes).    [provider]  Turmeric (QC TUMERIC COMPLEX PO) Take 2,000 mg by mouth daily. 1000 mg each    [provider]    Family History Family History  Problem Relation Age of Onset   Diabetes Mother    Hypertension Mother    Leukemia Father    Colon cancer Neg Hx    Esophageal cancer Neg Hx    Inflammatory bowel disease Neg Hx    Liver disease Neg Hx    Pancreatic cancer Neg Hx    Rectal cancer Neg Hx    Stomach cancer Neg Hx     Social History Social History   Tobacco Use   Smoking status: Former    Current packs/day: 0.00    Average packs/day: 0.3 packs/day for 11.0 years (2.8 ttl pk-yrs)    Types: Cigarettes    Start date: 03/19/1984    Quit date: 03/20/1995    Years since quitting: 28.0   Smokeless tobacco: Never  Vaping Use   Vaping status: Never Used  Substance Use Topics   Alcohol use: Never   Drug use: Never     Allergies   Metronidazole, Peach [prunus persica], and Augmentin [amoxicillin-pot clavulanate]   Review of Systems Review of Systems  Constitutional:  Negative for activity change, appetite change, fatigue and fever.  Respiratory:  Negative for cough (Resolved) and shortness of breath.   Gastrointestinal:  Negative for abdominal pain, diarrhea (Resolved), nausea and vomiting.  Genitourinary:  Positive for vaginal discharge and vaginal pain. Negative for dysuria, frequency, pelvic pain, urgency and vaginal bleeding.     Physical Exam Triage Vital Signs ED Triage Vitals [04/06/23 1040]  Encounter Vitals Group     BP (!) 151/81     Systolic BP Percentile      Diastolic BP Percentile      Pulse Rate 96     Resp 18     Temp 98.3 F (36.8 C)     Temp Source Oral     SpO2 96 %     Weight      Height      Head Circumference      Peak Flow      Pain Score 0     Pain Loc      Pain Education      Exclude from  Growth Chart    No data found.  Updated Vital Signs BP (!) 151/81 (BP Location: Left Arm)   Pulse 96   Temp 98.3 F (36.8 C) (Oral)   Resp 18   SpO2 96%   Visual Acuity Right Eye Distance:   Left Eye Distance:   Bilateral Distance:    Right Eye Near:   Left Eye Near:    Bilateral Near:     Physical Exam Vitals reviewed.  Constitutional:      General: She is awake. She is not in acute distress.    Appearance: Normal appearance. She  is well-developed. She is not ill-appearing.     Comments: Very pleasant female appears stated age in no acute distress sitting comfortably in exam room  HENT:     Head: Normocephalic and atraumatic.     Right Ear: Tympanic membrane, ear canal and external ear normal. Tympanic membrane is not erythematous or bulging.     Left Ear: Tympanic membrane, ear canal and external ear normal. Tympanic membrane is not erythematous or bulging.     Nose: Nose normal.     Mouth/Throat:     Pharynx: Uvula midline. No oropharyngeal exudate or posterior oropharyngeal erythema.  Cardiovascular:     Rate and Rhythm: Normal rate and regular rhythm.     Heart sounds: Normal heart sounds, S1 normal and S2 normal. No murmur heard. Pulmonary:     Effort: Pulmonary effort is normal.     Breath sounds: Normal breath sounds. No wheezing, rhonchi or rales.     Comments: Clear to auscultation bilaterally Abdominal:     General: Bowel sounds are normal.     Palpations: Abdomen is soft.     Tenderness: There is no abdominal tenderness. There is no right CVA tenderness, left CVA tenderness, guarding or rebound.     Comments: Benign abdominal exam  Genitourinary:    Comments: Exam deferred Psychiatric:        Behavior: Behavior is cooperative.      UC Treatments / Results  Labs (all labs ordered are listed, but only abnormal results are displayed) Labs Reviewed - No data to display  EKG   Radiology No results found.  Procedures Procedures (including  critical care time)  Medications Ordered in UC Medications - No data to display  Initial Impression / Assessment and Plan / UC Course  I have reviewed the triage vital signs and the nursing notes.  Pertinent labs & imaging results that were available during my care of the patient were reviewed by me and considered in my medical decision making (see chart for details).     Patient is well-appearing, afebrile, nontoxic, nontachycardic.  She reports that her cough has resolved and so x-ray was deferred as she had no adventitious lung sounds and her oxygen saturation was 96%.  She continued have some nasal congestion but recommended she begin over-the-counter antihistamine and fluticasone nasal spray.  She is taking 7 days of the Augmentin we discussed that this is an appropriate amount of medication so would discontinue antibiotics.  Augmentin was added to her allergy list as an intolerance with associated diarrhea.  Low suspicion for C. difficile as her diarrhea has resolved since discontinuing the medication.  We discussed that if anything changes and she has abdominal pain, recurrent diarrhea, melena, medic easier she should be seen immediately.  Suspect vaginal irritation is related to antibiotic induced yeast infection.  Will start Diflucan 1 dose today and a second dose in 72 hours if symptoms persist.  She was encouraged to use loosefitting cotton underwear and hypoallergenic soaps and detergents.  We discussed that if her symptoms persist or if anything changes she should return for reevaluation.  Strict return precautions given.  All questions answered to patient satisfaction.  Final Clinical Impressions(s) / UC Diagnoses   Final diagnoses:  Adverse reaction to amoxicillin/clavulanate  Diarrhea, unspecified type  Antibiotic-induced yeast infection     Discharge Instructions      Stop the antibiotic.  I am glad that the diarrhea has improved.  Eat a bland diet and drink plenty of  fluid.  If anything worsens and you have recurrent diarrhea, abdominal pain, blood in your stool, nausea, vomiting, fever you need to be seen immediately.  I believe you have a yeast infection caused by the antibiotic.  Start Diflucan today and a second dose in 3 days if your symptoms persist.  Wear loosefitting cotton underwear and use hypoallergenic soaps and detergents.  If your symptoms are not improving or if anything worsens and you have abdominal pain, pelvic pain, fever, nausea, vomiting you need to be seen immediately.     ED Prescriptions     Medication Sig Dispense Auth. Provider   fluconazole (DIFLUCAN) 150 MG tablet Take 1 tablet (150 mg total) by mouth every 3 (three) days as needed for up to 2 doses. 2 tablet Lurie Mullane, Noberto Retort, PA-C      PDMP not reviewed this encounter.   Jeani Hawking, PA-C 04/06/23 1107

## 2023-04-06 NOTE — ED Triage Notes (Signed)
Pt here for diarrhea since taking augmentin for sinus issues; pt sts sinus issues resolved; pt also sts some vaginal itching

## 2023-04-06 NOTE — Discharge Instructions (Signed)
Stop the antibiotic.  I am glad that the diarrhea has improved.  Eat a bland diet and drink plenty of fluid.  If anything worsens and you have recurrent diarrhea, abdominal pain, blood in your stool, nausea, vomiting, fever you need to be seen immediately.  I believe you have a yeast infection caused by the antibiotic.  Start Diflucan today and a second dose in 3 days if your symptoms persist.  Wear loosefitting cotton underwear and use hypoallergenic soaps and detergents.  If your symptoms are not improving or if anything worsens and you have abdominal pain, pelvic pain, fever, nausea, vomiting you need to be seen immediately.

## 2023-04-12 ENCOUNTER — Telehealth: Payer: Self-pay | Admitting: Gastroenterology

## 2023-04-12 NOTE — Telephone Encounter (Signed)
Patient called back and stated that she really want to get her colonoscopy procedure scheduled as soon as possible. Patient is requesting a call back. Please advise.

## 2023-04-12 NOTE — Telephone Encounter (Signed)
Patient called stating she would like a call back to schedule procedure done at the hospital.

## 2023-04-12 NOTE — Telephone Encounter (Signed)
The pt has been advised that I will call her next week with an appt in March for colon EMR.  The pt has been advised of the information and verbalized understanding.

## 2023-04-15 ENCOUNTER — Other Ambulatory Visit: Payer: Self-pay

## 2023-04-15 DIAGNOSIS — K635 Polyp of colon: Secondary | ICD-10-CM

## 2023-04-15 MED ORDER — SUFLAVE 178.7 G PO SOLR: 1.0000 | Freq: Once | ORAL | 0 refills | Status: AC

## 2023-04-15 NOTE — Telephone Encounter (Signed)
Colon EMR  scheduled, pt instructed and medications reviewed.  Patient instructions mailed to home.  Patient to call with any questions or concerns.

## 2023-04-22 ENCOUNTER — Ambulatory Visit: Payer: 59 | Admitting: Neurology

## 2023-05-27 ENCOUNTER — Encounter (HOSPITAL_COMMUNITY): Payer: Self-pay | Admitting: Gastroenterology

## 2023-05-27 ENCOUNTER — Telehealth: Payer: Self-pay | Admitting: Gastroenterology

## 2023-05-27 NOTE — Telephone Encounter (Signed)
 Procedure:Colonoscopy Procedure date: 06/03/23 Procedure location: [facility] Arrival Time: 10:00 am Spoke with the patient Y/N: No, I left a detailed message for the patient to return call 05/27/23 @ 9:40 am.   Any prep concerns? ___  Has the patient obtained the prep from the pharmacy ? ___ Do you have a care partner and transportation: ___ Any additional concerns? ___

## 2023-05-30 NOTE — Telephone Encounter (Signed)
 Inbound call from patient returning phone call. Stated she was advised to call back and inform if she was still experiencing cold like symptoms. States she is still experiencing symptoms. Please advise, thank you.

## 2023-05-30 NOTE — Telephone Encounter (Signed)
 Left message on machine to call back

## 2023-05-30 NOTE — Telephone Encounter (Signed)
[phone]  

## 2023-05-30 NOTE — Telephone Encounter (Signed)
 Spoke with the pt and she tells me that she is getting over some sinus issues. She has no fever, cough, mucous or other complaints. She feels fine and prefers to keep appt as planned. I have advised that should she not continue to improve or she develops fever, cough or other symptoms she should call our office. She will keep appt as planned for now.

## 2023-06-03 ENCOUNTER — Encounter (HOSPITAL_COMMUNITY)
Admission: RE | Disposition: A | Discharge status: Home / Self Care | Payer: Self-pay | Source: Ambulatory Visit | Attending: Gastroenterology

## 2023-06-03 ENCOUNTER — Ambulatory Visit (HOSPITAL_COMMUNITY)
Admission: RE | Admit: 2023-06-03 | Discharge: 2023-06-03 | Disposition: A | Discharge status: Home / Self Care | Payer: Medicare Other | Source: Ambulatory Visit | Attending: Gastroenterology | Admitting: Gastroenterology

## 2023-06-03 ENCOUNTER — Ambulatory Visit (HOSPITAL_COMMUNITY)

## 2023-06-03 ENCOUNTER — Encounter (HOSPITAL_COMMUNITY): Payer: Self-pay | Admitting: Gastroenterology

## 2023-06-03 ENCOUNTER — Other Ambulatory Visit: Payer: Self-pay

## 2023-06-03 DIAGNOSIS — Z860101 Personal history of adenomatous and serrated colon polyps: Secondary | ICD-10-CM

## 2023-06-03 DIAGNOSIS — Z87891 Personal history of nicotine dependence: Secondary | ICD-10-CM | POA: Diagnosis not present

## 2023-06-03 DIAGNOSIS — K649 Unspecified hemorrhoids: Secondary | ICD-10-CM | POA: Diagnosis not present

## 2023-06-03 DIAGNOSIS — Z833 Family history of diabetes mellitus: Secondary | ICD-10-CM | POA: Insufficient documentation

## 2023-06-03 DIAGNOSIS — K635 Polyp of colon: Secondary | ICD-10-CM

## 2023-06-03 DIAGNOSIS — Z6841 Body Mass Index (BMI) 40.0 and over, adult: Secondary | ICD-10-CM | POA: Insufficient documentation

## 2023-06-03 DIAGNOSIS — D128 Benign neoplasm of rectum: Secondary | ICD-10-CM

## 2023-06-03 DIAGNOSIS — K641 Second degree hemorrhoids: Secondary | ICD-10-CM | POA: Diagnosis not present

## 2023-06-03 DIAGNOSIS — E119 Type 2 diabetes mellitus without complications: Secondary | ICD-10-CM | POA: Diagnosis not present

## 2023-06-03 DIAGNOSIS — K5730 Diverticulosis of large intestine without perforation or abscess without bleeding: Secondary | ICD-10-CM

## 2023-06-03 DIAGNOSIS — Z9889 Other specified postprocedural states: Secondary | ICD-10-CM

## 2023-06-03 DIAGNOSIS — D120 Benign neoplasm of cecum: Secondary | ICD-10-CM | POA: Diagnosis not present

## 2023-06-03 DIAGNOSIS — Z1211 Encounter for screening for malignant neoplasm of colon: Secondary | ICD-10-CM | POA: Insufficient documentation

## 2023-06-03 DIAGNOSIS — K6289 Other specified diseases of anus and rectum: Secondary | ICD-10-CM | POA: Insufficient documentation

## 2023-06-03 DIAGNOSIS — K644 Residual hemorrhoidal skin tags: Secondary | ICD-10-CM | POA: Insufficient documentation

## 2023-06-03 DIAGNOSIS — G4733 Obstructive sleep apnea (adult) (pediatric): Secondary | ICD-10-CM

## 2023-06-03 DIAGNOSIS — D127 Benign neoplasm of rectosigmoid junction: Secondary | ICD-10-CM | POA: Diagnosis not present

## 2023-06-03 DIAGNOSIS — G4730 Sleep apnea, unspecified: Secondary | ICD-10-CM | POA: Diagnosis not present

## 2023-06-03 DIAGNOSIS — E66813 Obesity, class 3: Secondary | ICD-10-CM | POA: Diagnosis not present

## 2023-06-03 HISTORY — PX: POLYPECTOMY: SHX5525

## 2023-06-03 HISTORY — PX: COLONOSCOPY WITH PROPOFOL: SHX5780

## 2023-06-03 SURGERY — COLONOSCOPY WITH PROPOFOL
Anesthesia: Monitor Anesthesia Care

## 2023-06-03 MED ORDER — PROPOFOL 1000 MG/100ML IV EMUL
INTRAVENOUS | Status: AC
  Filled 2023-06-03: qty 100

## 2023-06-03 MED ORDER — LIDOCAINE HCL (CARDIAC) PF 100 MG/5ML IV SOSY
PREFILLED_SYRINGE | INTRAVENOUS | Status: DC | PRN
  Administered 2023-06-03: 100 mg via INTRAVENOUS

## 2023-06-03 MED ORDER — LACTATED RINGERS IV SOLN: INTRAVENOUS | Status: DC | PRN

## 2023-06-03 MED ORDER — PROPOFOL 10 MG/ML IV BOLUS
INTRAVENOUS | Status: DC | PRN
  Administered 2023-06-03: 20 mg via INTRAVENOUS
  Administered 2023-06-03: 60 mg via INTRAVENOUS

## 2023-06-03 MED ORDER — SODIUM CHLORIDE 0.9 % IV SOLN: INTRAVENOUS | Status: DC

## 2023-06-03 MED ORDER — PROPOFOL 500 MG/50ML IV EMUL
INTRAVENOUS | Status: DC | PRN
Start: 2023-06-03 — End: 2023-06-03
  Administered 2023-06-03: 100 ug/kg/min via INTRAVENOUS

## 2023-06-03 SURGICAL SUPPLY — 20 items

## 2023-06-03 NOTE — Op Note (Signed)
 [facility] Patient Name: Michaela Alvarado Procedure Date: 06/03/2023 MRN: [id number] Attending MD: Corliss Parish , MD, [id number] Date of Birth: 05-25-1955 CSN: [id number] Age: 68 Admit Type: Outpatient Procedure:                Colonoscopy Indications:              Surveillance: History of piecemeal removal adenoma                            on last colonoscopy (< 3 yrs), High risk colon                            cancer surveillance: Personal history of adenoma                            (10 mm or greater in size) Providers:                Corliss Parish, MD, Norman Clay, RN, Salley Scarlet, Technician Referring MD:             Liliane Shi DO, DO Medicines:                Monitored Anesthesia Care Complications:            No immediate complications. Estimated Blood Loss:     Estimated blood loss was minimal. Procedure:                Pre-Anesthesia Assessment:                           - Prior to the procedure, a History and Physical                            was performed, and patient medications and                            allergies were reviewed. The patient's tolerance of                            previous anesthesia was also reviewed. The risks                            and benefits of the procedure and the sedation                            options and risks were discussed with the patient.                            All questions were answered, and informed consent                            was obtained. Prior Anticoagulants: The patient has  taken no anticoagulant or antiplatelet agents. ASA                            Grade Assessment: II - A patient with mild systemic                            disease. After reviewing the risks and benefits,                            the patient was deemed in satisfactory condition to                            undergo the procedure.                            After obtaining informed consent, the colonoscope                            was passed under direct vision. Throughout the                            procedure, the patient's blood pressure, pulse, and                            oxygen saturations were monitored continuously. The                            [id number]-HQ190L ([id number]) Olympus colonoscope was                            introduced through the anus and advanced to the 3                            cm into the ileum. The colonoscopy was performed                            without difficulty. The patient tolerated the                            procedure. The quality of the bowel preparation was                            adequate. The terminal ileum, ileocecal valve,                            appendiceal orifice, and rectum were photographed. Scope In: 10:39:19 AM Scope Out: 10:53:58 AM Scope Withdrawal Time: 0 hours 12 minutes 31 seconds  Total Procedure Duration: 0 hours 14 minutes 39 seconds  Findings:      The digital rectal exam findings include hemorrhoids. Pertinent       negatives include no palpable rectal lesions.      The terminal ileum and ileocecal valve appeared normal.      A medium post mucosectomy scar was found in the cecum. The scar tissue  was healthy in appearance. There was no evidence of the previous polyp.      Four sessile polyps were found in the rectum (2), recto-sigmoid colon       (1) and cecum (1). The polyps were 2 to 3 mm in size. These polyps were       removed with a cold snare. Resection and retrieval were complete.      Multiple small-mouthed diverticula were found in the recto-sigmoid colon       and sigmoid colon.      Normal mucosa was found in the entire colon otherwise.      Anal papilla were hypertrophied.      Non-bleeding non-thrombosed external and internal hemorrhoids were found       during retroflexion, during perianal exam and during digital exam. The        hemorrhoids were Grade II (internal hemorrhoids that prolapse but reduce       spontaneously). Impression:               - Hemorrhoids found on digital rectal exam.                           - The examined portion of the ileum was normal.                           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                           - Four, 2 to 3 mm polyps in the rectum, at the                            recto-sigmoid colon and in the cecum, removed with                            a cold snare. Resected and retrieved.                           - Diverticulosis in the recto-sigmoid colon and in                            the sigmoid colon.                           - Normal mucosa in the entire examined colon.                           - Anal papilla were hypertrophied.                           - Non-bleeding non-thrombosed external and internal                            hemorrhoids. Moderate Sedation:      Not Applicable - Patient had care per Anesthesia. Recommendation:           - The patient will be observed post-procedure,  until all discharge criteria are met.                           - Discharge patient to home.                           - Patient has a contact number available for                            emergencies. The signs and symptoms of potential                            delayed complications were discussed with the                            patient. Return to normal activities tomorrow.                            Written discharge instructions were provided to the                            patient.                           - High fiber diet.                           - Use FiberCon 1-2 tablets PO daily.                           - Continue present medications.                           - Await pathology results.                           - Repeat colonoscopy in 3 years for surveillance;                            no matter pathology as a  result of history of                            advanced adenoma. The patient should not go any                            further than 5 years between colonoscopies                            thereafter.                           - The findings and recommendations were discussed                            with the patient.                           -  The findings and recommendations were discussed                            with the patient's family. Procedure Code(s):        --- Professional ---                           [phone], Colonoscopy, flexible; with removal of                            tumor(s), polyp(s), or other lesion(s) by snare                            technique Diagnosis Code(s):        --- Professional ---                           K64.1, Second degree hemorrhoids                           Z98.890, Other specified postprocedural states                           D12.8, Benign neoplasm of rectum                           D12.7, Benign neoplasm of rectosigmoid junction                           D12.0, Benign neoplasm of cecum                           K62.89, Other specified diseases of anus and rectum                           Z86.010, Personal history of colonic polyps                           K57.30, Diverticulosis of large intestine without                            perforation or abscess without bleeding CPT copyright 2022 [employer]. All rights reserved. The codes documented in this report are preliminary and upon coder review may  be revised to meet current compliance requirements. Corliss Parish, MD 06/03/2023 11:06:27 AM Number of Addenda: 0

## 2023-06-03 NOTE — H&P (Signed)
 GASTROENTEROLOGY PROCEDURE H&P NOTE   Primary Care Physician: Debroah Loop, DO  HPI: Michaela Alvarado is a 68 y.o. 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  Past Medical History:  Diagnosis Date   Diabetes mellitus without complication (HCC)    PRE DIABETIC   Sleep apnea    Past Surgical History:  Procedure Laterality Date   COLONOSCOPY WITH PROPOFOL N/A 07/11/2022   Procedure: COLONOSCOPY WITH PROPOFOL;  Surgeon: Meridee Score Netty Starring., MD;  Location: [facility];  Service: Gastroenterology;  Laterality: N/A;   ENDOSCOPIC MUCOSAL RESECTION N/A 07/11/2022   Procedure: ENDOSCOPIC MUCOSAL RESECTION;  Surgeon: Meridee Score Netty Starring., MD;  Location: [facility];  Service: Gastroenterology;  Laterality: N/A;   HEMOSTASIS CLIP PLACEMENT  07/11/2022   Procedure: HEMOSTASIS CLIP PLACEMENT;  Surgeon: Lemar Lofty., MD;  Location: [facility];  Service: Gastroenterology;;   PARTIAL HYSTERECTOMY N/A 03/19/2010   POLYPECTOMY  07/11/2022   Procedure: POLYPECTOMY;  Surgeon: Mansouraty, Netty Starring., MD;  Location: [facility];  Service: Gastroenterology;;   Sunnie Nielsen LIFTING INJECTION  07/11/2022   Procedure: SUBMUCOSAL LIFTING INJECTION;  Surgeon: Lemar Lofty., MD;  Location: [facility];  Service: Gastroenterology;;   UTERINE FIBROID SURGERY     Current Facility-Administered Medications  Medication Dose Route Frequency Provider Last Rate Last Admin   0.9 %  sodium chloride infusion   Intravenous Continuous Mansouraty, Netty Starring., MD        Current Facility-Administered Medications:    0.9 %  sodium chloride infusion, , Intravenous, Continuous, Mansouraty, Netty Starring., MD Allergies  Allergen Reactions   Metronidazole Hives    Other Reaction(s): Not available   Peach [Prunus Persica] Hives    Fruit with fine hair   Augmentin [Amoxicillin-Pot Clavulanate] Diarrhea   Family History  Problem Relation  Age of Onset   Diabetes Mother    Hypertension Mother    Leukemia Father    Colon cancer Neg Hx    Esophageal cancer Neg Hx    Inflammatory bowel disease Neg Hx    Liver disease Neg Hx    Pancreatic cancer Neg Hx    Rectal cancer Neg Hx    Stomach cancer Neg Hx    Social History   Socioeconomic History   Marital status: Married    Spouse name: Not on file   Number of children: 2   Years of education: Not on file   Highest education level: Not on file  Occupational History   Not on file  Tobacco Use   Smoking status: Former    Current packs/day: 0.00    Average packs/day: 0.3 packs/day for 11.0 years (2.8 ttl pk-yrs)    Types: Cigarettes    Start date: 03/19/1984    Quit date: 03/20/1995    Years since quitting: 28.2   Smokeless tobacco: Never  Vaping Use   Vaping status: Never Used  Substance and Sexual Activity   Alcohol use: Never   Drug use: Never   Sexual activity: Not on file  Other Topics Concern   Not on file  Social History Narrative   Right Handed   Lives in two story home   Drinks 1-2 cups of coffee daily   Social Drivers of Health   Financial Resource Strain: Not on file  Food Insecurity: Not on file  Transportation Needs: Not on file  Physical Activity: Not on file  Stress: Not on file  Social Connections: Not on file  Intimate Partner  Violence: Not on file    Physical Exam: Today's Vitals   06/03/23 0946  BP: (!) 145/83  Pulse: 91  Resp: (!) 28  Temp: (!) 97.1 F (36.2 C)  TempSrc: Temporal  SpO2: 98%  Weight: 111.1 kg  Height: 5\' 5"  (1.651 m)  PainSc: 0-No pain   Body mass index is 40.77 kg/m. GEN: NAD EYE: Sclerae anicteric ENT: MMM CV: Non-tachycardic GI: Soft, NT/ND NEURO:  Alert & Oriented x 3  Lab Results: No results for input(s): "WBC", "HGB", "HCT", "PLT" in the last 72 hours. BMET No results for input(s): "NA", "K", "CL", "CO2", "GLUCOSE", "BUN", "CREATININE", "CALCIUM" in the last 72 hours. LFT No results for  input(s): "PROT", "ALBUMIN", "AST", "ALT", "ALKPHOS", "BILITOT", "BILIDIR", "IBILI" in the last 72 hours. PT/INR No results for input(s): "LABPROT", "INR" in the last 72 hours.   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  The risks and benefits of endoscopic evaluation/treatment were discussed with the patient and/or family; these include but are not limited to the risk of perforation, infection, bleeding, missed lesions, lack of diagnosis, severe illness requiring hospitalization, as well as anesthesia and sedation related illnesses.  The patient's history has been reviewed, patient examined, no change in status, and deemed stable for procedure.  The patient and/or family is agreeable to proceed.    Corliss Parish, MD [facility] Office # [phone]

## 2023-06-03 NOTE — Transfer of Care (Signed)
 Immediate Anesthesia Transfer of Care Note  Patient: Michaela Alvarado  Procedure(s) Performed: COLONOSCOPY WITH PROPOFOL POLYPECTOMY  Patient Location: PACU  Anesthesia Type:MAC  Level of Consciousness: awake, alert , and oriented  Airway & Oxygen Therapy: Patient Spontanous Breathing and Patient connected to nasal cannula oxygen  Post-op Assessment: Report given to RN and Post -op Vital signs reviewed and stable  Post vital signs: Reviewed and stable  Last Vitals:  Vitals Value Taken Time  BP    Temp    Pulse 69 06/03/23 1102  Resp 23 06/03/23 1102  SpO2 99 % 06/03/23 1102  Vitals shown include unfiled device data.  Last Pain:  Vitals:   06/03/23 0946  TempSrc: Temporal  PainSc: 0-No pain         Complications: No notable events documented.

## 2023-06-03 NOTE — Anesthesia Preprocedure Evaluation (Signed)
 Anesthesia Evaluation  Patient identified by MRN, date of birth, ID band Patient awake    Reviewed: Allergy & Precautions, NPO status , Patient's Chart, lab work & pertinent test results  Airway Mallampati: III  TM Distance: >3 FB Neck ROM: Full    Dental no notable dental hx.    Pulmonary sleep apnea and Continuous Positive Airway Pressure Ventilation , former smoker   Pulmonary exam normal        Cardiovascular negative cardio ROS Normal cardiovascular exam     Neuro/Psych negative neurological ROS  negative psych ROS   GI/Hepatic Neg liver ROS, Bowel prep,,,  Endo/Other  diabetes  Class 3 obesity  Renal/GU negative Renal ROS     Musculoskeletal negative musculoskeletal ROS (+)    Abdominal  (+) + obese  Peds  Hematology negative hematology ROS (+)   Anesthesia Other Findings cecal polyp  Reproductive/Obstetrics                             Anesthesia Physical Anesthesia Plan  ASA: 3  Anesthesia Plan: MAC   Post-op Pain Management:    Induction:   PONV Risk Score and Plan: 2 and Propofol infusion and Treatment may vary due to age or medical condition  Airway Management Planned: Simple Face Mask  Additional Equipment:   Intra-op Plan:   Post-operative Plan:   Informed Consent: I have reviewed the patients History and Physical, chart, labs and discussed the procedure including the risks, benefits and alternatives for the proposed anesthesia with the patient or authorized representative who has indicated his/her understanding and acceptance.     Dental advisory given  Plan Discussed with: CRNA  Anesthesia Plan Comments:        Anesthesia Quick Evaluation

## 2023-06-03 NOTE — Discharge Instructions (Signed)

## 2023-06-04 ENCOUNTER — Encounter: Payer: Self-pay | Admitting: Gastroenterology

## 2023-06-04 LAB — SURGICAL PATHOLOGY

## 2023-06-04 NOTE — Anesthesia Postprocedure Evaluation (Signed)
 Anesthesia Post Note  Patient: Michaela Alvarado  Procedure(s) Performed: COLONOSCOPY WITH PROPOFOL POLYPECTOMY     Patient location during evaluation: Endoscopy Anesthesia Type: MAC Level of consciousness: awake Pain management: pain level controlled Vital Signs Assessment: post-procedure vital signs reviewed and stable Respiratory status: spontaneous breathing, nonlabored ventilation and respiratory function stable Cardiovascular status: blood pressure returned to baseline and stable Postop Assessment: no apparent nausea or vomiting Anesthetic complications: no   No notable events documented.  Last Vitals:  Vitals:   06/03/23 1110 06/03/23 1121  BP: (!) 151/84 (!) 130/59  Pulse: 71 71  Resp: 17 (!) 21  Temp:    SpO2: 98% 100%    Last Pain:  Vitals:   06/03/23 1121  TempSrc:   PainSc: 0-No pain                 [name] P Lavera Vandermeer

## 2023-06-06 ENCOUNTER — Encounter (HOSPITAL_COMMUNITY): Payer: Self-pay | Admitting: Gastroenterology

## 2023-07-28 ENCOUNTER — Other Ambulatory Visit: Payer: Self-pay

## 2023-07-28 ENCOUNTER — Emergency Department (HOSPITAL_COMMUNITY)
Admission: EM | Admit: 2023-07-28 | Discharge: 2023-07-29 | Disposition: A | Discharge status: Home / Self Care | Attending: Emergency Medicine | Admitting: Emergency Medicine

## 2023-07-28 ENCOUNTER — Encounter (HOSPITAL_COMMUNITY): Payer: Self-pay | Admitting: *Deleted

## 2023-07-28 ENCOUNTER — Emergency Department (HOSPITAL_COMMUNITY)

## 2023-07-28 DIAGNOSIS — R0789 Other chest pain: Secondary | ICD-10-CM | POA: Diagnosis present

## 2023-07-28 LAB — CBC
HCT: 44.7 % (ref 36.0–46.0)
Hemoglobin: 14.1 g/dL (ref 12.0–15.0)
MCH: 25.1 pg — ABNORMAL LOW (ref 26.0–34.0)
MCHC: 31.5 g/dL (ref 30.0–36.0)
MCV: 79.7 fL — ABNORMAL LOW (ref 80.0–100.0)
Platelets: 333 10*3/uL (ref 150–400)
RBC: 5.61 MIL/uL — ABNORMAL HIGH (ref 3.87–5.11)
RDW: 14.6 % (ref 11.5–15.5)
WBC: 11.4 10*3/uL — ABNORMAL HIGH (ref 4.0–10.5)
nRBC: 0 % (ref 0.0–0.2)

## 2023-07-28 LAB — BASIC METABOLIC PANEL WITH GFR
Anion gap: 12 (ref 5–15)
BUN: 19 mg/dL (ref 8–23)
CO2: 24 mmol/L (ref 22–32)
Calcium: 9.2 mg/dL (ref 8.9–10.3)
Chloride: 108 mmol/L (ref 98–111)
Creatinine, Ser: 1.32 mg/dL — ABNORMAL HIGH (ref 0.44–1.00)
GFR, Estimated: 45 mL/min — ABNORMAL LOW (ref >60)
Glucose, Bld: 123 mg/dL — ABNORMAL HIGH (ref 70–99)
Potassium: 4.6 mmol/L (ref 3.5–5.1)
Sodium: 144 mmol/L (ref 135–145)

## 2023-07-28 LAB — TROPONIN I (HIGH SENSITIVITY)
Troponin I (High Sensitivity): 9 ng/L (ref <18)
Troponin I (High Sensitivity): 10 ng/L (ref <18)

## 2023-07-28 NOTE — ED Provider Notes (Signed)
 [place] EMERGENCY DEPARTMENT AT [facility] Provider Note   CSN: [id number] Arrival date & time: 07/28/23  2023     History  Chief Complaint  Patient presents with   Chest Pain    Michaela Alvarado is a 68 y.o. female presenting to ED for left-sided chest pain.  Patient reports she was doing lateral chest wall exercises about 2 weeks ago.  About a week ago she began having discomfort in the left side of her chest under her left breast.  She says specifically it is worse at night when she is lying down, tends to go away in the daytime and with activity.  She came into the ED because the pain has not gone away completely.  She tried GI medications which did not help, but did not try any other medications at home.  Denies cough, fevers, chills, history of DVT or PE, unilateral leg swelling or pain, calf pain, history of coronary disease.  HPI     Home Medications Prior to Admission medications   Medication Sig Start Date End Date Taking? Authorizing Provider  fluconazole  (DIFLUCAN ) 150 MG tablet Take 1 tablet (150 mg total) by mouth every 3 (three) days as needed for up to 2 doses. 04/06/23   Raspet, Erin K, PA-C  fluticasone  (FLONASE ) 50 MCG/ACT nasal spray Place 1 spray into both nostrils 2 (two) times daily as needed for allergies or rhinitis. 03/27/23   Buena Carmine, NP  meclizine  (ANTIVERT ) 25 MG tablet Take 1 tablet (25 mg total) by mouth 3 (three) times daily as needed for dizziness. 11/08/21   Afton Albright, MD  Multiple Vitamins-Minerals (HAIR SKIN & NAILS PO) Take 2 tablets by mouth daily.    [provider]  naproxen  (NAPROSYN ) 500 MG tablet Take 1 tablet (500 mg total) by mouth 2 (two) times daily as needed for moderate pain or mild pain. 07/18/22   Mansouraty, Albino Alu., MD  nortriptyline  (PAMELOR ) 10 MG capsule Take 1 capsule (10 mg total) by mouth at bedtime. 05/16/22   Merriam Abbey, DO  ondansetron  (ZOFRAN -ODT) 4 MG disintegrating tablet Take 1  tablet (4 mg total) by mouth every 8 (eight) hours as needed for nausea or vomiting. 11/08/21   Afton Albright, MD  Polyethyl Glycol-Propyl Glycol (SYSTANE) 0.4-0.3 % SOLN Place 1 drop into both eyes daily as needed (Dry eyes).    [provider]  Turmeric (QC TUMERIC COMPLEX PO) Take 2,000 mg by mouth daily. 1000 mg each    [provider]      Allergies    Metronidazole, Peach [prunus persica], and Augmentin  [amoxicillin -pot clavulanate]    Review of Systems   Review of Systems  Physical Exam Updated Vital Signs BP (!) 163/77   Pulse 81   Temp 98.1 F (36.7 C)   Resp 16   Ht 5\' 5"  (1.651 m)   Wt 111.1 kg   SpO2 100%   BMI 40.76 kg/m  Physical Exam Constitutional:      General: She is not in acute distress. HENT:     Head: Normocephalic and atraumatic.  Eyes:     Conjunctiva/sclera: Conjunctivae normal.     Pupils: Pupils are equal, round, and reactive to light.  Cardiovascular:     Rate and Rhythm: Normal rate and regular rhythm.  Pulmonary:     Effort: Pulmonary effort is normal. No respiratory distress.  Abdominal:     General: There is no distension.     Tenderness: There is no  abdominal tenderness.  Musculoskeletal:     Comments: Left chest wall pain reproducible with palpation  Skin:    General: Skin is warm and dry.  Neurological:     General: No focal deficit present.     Mental Status: She is alert. Mental status is at baseline.  Psychiatric:        Mood and Affect: Mood normal.        Behavior: Behavior normal.     ED Results / Procedures / Treatments   Labs (all labs ordered are listed, but only abnormal results are displayed) Labs Reviewed  BASIC METABOLIC PANEL WITH GFR - Abnormal; Notable for the following components:      Result Value   Glucose, Bld 123 (*)    Creatinine, Ser 1.32 (*)    GFR, Estimated 45 (*)    All other components within normal limits  CBC - Abnormal; Notable for the following components:   WBC 11.4 (*)     RBC 5.61 (*)    MCV 79.7 (*)    MCH 25.1 (*)    All other components within normal limits  TROPONIN I (HIGH SENSITIVITY)  TROPONIN I (HIGH SENSITIVITY)    EKG EKG Interpretation Date/Time:  Sunday Jul 28 2023 20:38:30 EDT Ventricular Rate:  85 PR Interval:  144 QRS Duration:  76 QT Interval:  382 QTC Calculation: 454 R Axis:   -32  Text Interpretation: Sinus rhythm with occasional Premature ventricular complexes Left axis deviation Abnormal ECG No previous ECGs available Confirmed by Jerald Molly [id number]) on 07/28/2023 9:16:38 PM  Radiology DG Chest 2 View Result Date: 07/28/2023 CLINICAL DATA:  chest pain for 2 days EXAM: CHEST - 2 VIEW COMPARISON:  None available. FINDINGS: The heart size and mediastinal contours are within normal limits. Both lungs are clear. No pneumothorax or pleural effusion. There are thoracic degenerative changes. IMPRESSION: No acute cardiopulmonary disease. Electronically Signed   By: Sydell Eva M.D.   On: 07/28/2023 21:12    Procedures Procedures    Medications Ordered in ED Medications - No data to display  ED Course/ Medical Decision Making/ A&P                                 Medical Decision Making Amount and/or Complexity of Data Reviewed Labs: ordered. Radiology: ordered. ECG/medicine tests: ordered.   This patient presents to the Emergency Department with complaint of chest pain. This involves an extensive number of treatment options, and is a complaint that carries with it a high risk of complications and morbidity, given the patient's comorbidity, including high blood pressure.The differential diagnosis includes ACS vs Pneumothorax vs Reflux/Gastritis vs MSK pain vs Pneumonia vs other.  I felt PE was less likely given that patient has no acute risk factors for PE, no tachycardia or hypoxia  I have a lower suspicion for this is a coronary syndrome with negative troponins, normal EKG, and the fact that her chest pain is not  exertional, and worse with lying down at night.  I do not see evidence of pericarditis on her EKG nor does she have history to suggest recent infection or pericarditis symptoms  I ordered, reviewed, and interpreted labs.  Pertinent results include no emergent findings on initial labs.  Initial troponin is 9.  Repeat troponin is pending I ordered imaging studies which included x-ray of the chest I independently visualized and interpreted imaging which showed no emergent findings and  the monitor tracing which showed some this rhythm. I agree with the radiologist interpretation I personally reviewed the patients ECG which showed sinus rhythm with no acute ischemic findings  Patient is well appearing on re-assessment, awaiting repeat troponin, anticipate discharge home if negative.  Suspect likely muscular chest wall pain - can try NSAIDS at home.  Return precautions provided.         Final Clinical Impression(s) / ED Diagnoses Final diagnoses:  Chest wall pain    Rx / DC Orders ED Discharge Orders     None         Arvilla Birmingham, MD 07/28/23 2336

## 2023-07-28 NOTE — ED Triage Notes (Signed)
 Chest pain for 2 days after  activity  no previous history  no sob no nausea no dizziness

## 2023-07-28 NOTE — ED Triage Notes (Signed)
 The pt reports that her chest was hurting for one week

## 2023-07-28 NOTE — ED Notes (Signed)
 ED Provider at bedside.

## 2023-07-28 NOTE — Discharge Instructions (Addendum)
 No evidence of heart attack or blood clot in the lung.  Follow-up with your primary doctor for a stress test.  Return to the ED exertional chest pain, pain associate with shortness of breath, nausea, vomiting, sweating or other concerns

## 2023-07-29 ENCOUNTER — Ambulatory Visit: Payer: Self-pay

## 2023-07-29 NOTE — ED Notes (Signed)
 Patient discharged in stable condition, education materials explained including, follow up, any prescriptions and reasons to return. Patient voiced agreement to education and discharge material.

## 2023-07-29 NOTE — ED Provider Notes (Signed)
 Parison from previous shift.  Pending second troponin.  Has atypical chest pain that comes and goes after working out with weights.  Troponin negative x 2.  Repeat EKG is unchanged.  Low suspicion for aortic dissection, pulmonary embolism, ACS.  Follow-up with PCP for possible stress test.  Return precautions discussed.   Earma Gloss, MD 07/29/23 [phone]

## 2023-09-09 NOTE — Progress Notes (Unsigned)
 NEUROLOGY FOLLOW UP OFFICE NOTE  Michaela Alvarado [id number]  Assessment/Plan:   Migraine without aura, without status migrainosus, not intractable     1.  Preventative medication not indicated but I advised to follow lifestyle modification (discussed sleep hygiene, hydration, diet, exercise) 2. Tylenol as needed.  Limit use of pain relievers to no more than 9 days out of the month to prevent risk of rebound or medication-overuse headache. 3.  Follow up as needed.  Total time spent in chart and face to face with patient:  13 minutes     Subjective:  Michaela Alvarado is a 68 year old right-handed female with diabetes and nodular goiter biopsied in 02/2019 with negative pathology who follows up for headache.  UPDATE: Intensity:  7/10 Duration:  1 to 2 hours with Tylenol and water Frequency:  1 to 2 times a week Often triggered by stress and dehydration.    Current NSAIDS/analgesics:  Tylenol, naproxen  500mg  Current triptans:  none Current ergotamine:  none Current anti-emetic:  Zofran  ODT 4mg  Current muscle relaxants:  none Current Antihypertensive medications:  none Current Antidepressant medications:  none Current Anticonvulsant medications:  none Current anti-CGRP:  none Current Vitamins/Herbal/Supplements:  none Current Antihistamines/Decongestants:  meclizine  25mg  Other therapy:  none Hormone/birth control:  none   HISTORY: H/o of migraines until partial hystecteomy in 2012   In 2016, she was found to have a sebaceous  cyst on top of her head.  She bumped her head over the cyst and developed pain radiating down the right side of her jaw behind her ear and into the right eye.  Sometimes associated with head tingling, nosebleed and photophobia but no photophobia or nausea.  Would last an hour when treated with a cup of coffee and Tylenol.  They were occurring 3 times a week.  Wakes up with headache.     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  Follow up MRI on 11/13/2016 demonstrated stability of the bone lesions and interval increase size of presumed scalp sebaceous cyst.  MRI of brain with and without contrast on 02/15/2019 showed 19 mm right parietal bone lesion with normal appearance of the brain.     Past NSAIDS/analgesics:  none Past abortive triptans:  none Past abortive ergotamine:  none Past muscle relaxants:  tizanidine  Past anti-emetic:  none Past antihypertensive medications:  none Past antidepressant medications:  nortriptyline  10mg  (effective) Past anticonvulsant medications:  none Past anti-CGRP:  none Past vitamins/Herbal/Supplements:  none Past antihistamines/decongestants:  none Other past therapies:  none    PAST MEDICAL HISTORY: Past Medical History:  Diagnosis Date   Diabetes mellitus without complication (HCC)    PRE DIABETIC   Sleep apnea     MEDICATIONS: Current Outpatient Medications on File Prior to Visit  Medication Sig Dispense Refill   fluconazole  (DIFLUCAN ) 150 MG tablet Take 1 tablet (150 mg total) by mouth every 3 (three) days as needed for up to 2 doses. 2 tablet 0   fluticasone  (FLONASE ) 50 MCG/ACT nasal spray Place 1 spray into both nostrils 2 (two) times daily as needed for allergies or rhinitis. 16 mL 0   meclizine  (ANTIVERT ) 25 MG tablet Take 1 tablet (25 mg total) by mouth 3 (three) times daily  as needed for dizziness. 30 tablet 0   Multiple Vitamins-Minerals (HAIR SKIN & NAILS PO) Take 2 tablets by mouth daily.     naproxen  (NAPROSYN ) 500 MG tablet Take 1 tablet (500 mg total) by mouth 2 (two) times daily as needed for moderate pain or mild pain.     nortriptyline  (PAMELOR ) 10 MG capsule Take 1 capsule (10 mg  total) by mouth at bedtime. 30 capsule 5   ondansetron  (ZOFRAN -ODT) 4 MG disintegrating tablet Take 1 tablet (4 mg total) by mouth every 8 (eight) hours as needed for nausea or vomiting. 15 tablet 0   Polyethyl Glycol-Propyl Glycol (SYSTANE) 0.4-0.3 % SOLN Place 1 drop into both eyes daily as needed (Dry eyes).     Turmeric (QC TUMERIC COMPLEX PO) Take 2,000 mg by mouth daily. 1000 mg each     No current facility-administered medications on file prior to visit.    ALLERGIES: Allergies  Allergen Reactions   Metronidazole Hives    Other Reaction(s): Not available   Peach [Prunus Persica] Hives    Fruit with fine hair   Augmentin  [Amoxicillin -Pot Clavulanate] Diarrhea    FAMILY HISTORY: Family History  Problem Relation Age of Onset   Diabetes Mother    Hypertension Mother    Leukemia Father    Colon cancer Neg Hx    Esophageal cancer Neg Hx    Inflammatory bowel disease Neg Hx    Liver disease Neg Hx    Pancreatic cancer Neg Hx    Rectal cancer Neg Hx    Stomach cancer Neg Hx       Objective:  Blood pressure 119/84, pulse 78, height 5' 8 (1.727 m), weight 250 lb (113.4 kg), SpO2 99%. General: No acute distress.  Patient appears well-groomed.   Head:  Normocephalic/atraumatic Head:  Normocephalic/atraumatic Neck:  Supple.  No paraspinal tenderness.  Full range of motion. Heart:  Regular rate and rhythm. Neuro:  Alert and oriented.  Speech fluent and not dysarthric.  Language intact.  CN II-XII intact.  Bulk and tone normal.  Muscle strength 5/5 throughout.  Sensation to light touch intact.  Deep tendon reflexes 2+ throughout, toes downgoing.  Gait normal.  Romberg negative.     Juliene Dunnings, DO  CC: Milo Costa, DO

## 2023-09-10 ENCOUNTER — Encounter: Payer: Self-pay | Admitting: Neurology

## 2023-09-10 ENCOUNTER — Ambulatory Visit (INDEPENDENT_AMBULATORY_CARE_PROVIDER_SITE_OTHER): Payer: 59 | Admitting: Neurology

## 2023-09-10 VITALS — BP 119/84 | HR 78 | Ht 68.0 in | Wt 250.0 lb

## 2023-09-10 DIAGNOSIS — G43009 Migraine without aura, not intractable, without status migrainosus: Secondary | ICD-10-CM

## 2023-09-10 NOTE — Patient Instructions (Signed)
  Limit use of pain relievers to no more than 9 days out of the month.  These medications include acetaminophen, NSAIDs (ibuprofen/Advil/Motrin, naproxen /Aleve , triptans (Imitrex/sumatriptan), Excedrin, and narcotics.  This will help reduce risk of rebound headaches. Be aware of common food triggers Routine exercise Stay adequately hydrated (aim for 64 oz water daily) Keep headache diary Maintain proper stress management Maintain proper sleep hygiene Do not skip meals Consider supplements:  magnesium citrate 400mg  daily, riboflavin 400mg  daily, coenzyme Q10 100mg  three times daily.

## 2023-11-01 ENCOUNTER — Other Ambulatory Visit: Payer: Self-pay | Admitting: Family Medicine

## 2023-11-01 ENCOUNTER — Ambulatory Visit
Admission: RE | Admit: 2023-11-01 | Discharge: 2023-11-01 | Disposition: A | Discharge status: Home / Self Care | Source: Ambulatory Visit | Attending: Family Medicine | Admitting: Family Medicine

## 2023-11-01 DIAGNOSIS — M79642 Pain in left hand: Secondary | ICD-10-CM

## 2023-12-03 ENCOUNTER — Telehealth: Payer: Self-pay

## 2023-12-03 NOTE — Telephone Encounter (Signed)
 Copied from CRM #[id number]. Topic: General - Other >> Dec 03, 2023  9:42 AM Benton O wrote: Reason for CRM: [facility] we would like to followup on the prescription for patient cpap supplies. This was sent over 11/26/2023. Gave [facility] the alternate fax number and they will resend fax today   Sent to wrong clinic    -NFN

## 2023-12-09 ENCOUNTER — Other Ambulatory Visit: Payer: Self-pay | Admitting: Family Medicine

## 2023-12-09 DIAGNOSIS — Z1231 Encounter for screening mammogram for malignant neoplasm of breast: Secondary | ICD-10-CM

## 2023-12-24 ENCOUNTER — Ambulatory Visit (INDEPENDENT_AMBULATORY_CARE_PROVIDER_SITE_OTHER)

## 2023-12-24 VITALS — BP 118/72 | HR 77 | Temp 98.0°F | Ht 65.0 in | Wt 251.2 lb

## 2023-12-24 DIAGNOSIS — Z23 Encounter for immunization: Secondary | ICD-10-CM

## 2023-12-24 DIAGNOSIS — G4733 Obstructive sleep apnea (adult) (pediatric): Secondary | ICD-10-CM

## 2023-12-24 DIAGNOSIS — E669 Obesity, unspecified: Secondary | ICD-10-CM

## 2023-12-24 DIAGNOSIS — Z6841 Body Mass Index (BMI) 40.0 and over, adult: Secondary | ICD-10-CM

## 2023-12-24 NOTE — Addendum Note (Signed)
 Addended by: BURT EVERTT RAMAN on: 12/24/2023 12:59 PM   Modules accepted: Orders

## 2023-12-24 NOTE — Patient Instructions (Addendum)
 It was a pleasure to see you today. We will send your supplies to [facility] Please take your CPAP machine with you when you travel

## 2023-12-24 NOTE — Progress Notes (Signed)
 [facility]  Chief Complaint  Patient presents with   Medication Management    Cpap renewal OSA IFZ:jijeu Overall patient is doing good, no SOB no COUGH  pt will get flu shot today Pt states sometimes she doesn't think she is getting enough o2 from cpap machine   This pt used to follow Dr Shellia and this is her first visit with me. Last OV 04/2021 for OSA. Hasn't been seen in the clinic for almost 2.5 years. Constitutional:  BP 118/72   Pulse 77   Temp 98 F (36.7 C) (Oral)   Ht 5' 5 (1.651 m)   Wt 251 lb 3.2 oz (113.9 kg)   SpO2 99% Comment: on RA  BMI 41.80 kg/m   Past Medical History:  Pre-diabetes, Goiter, Headache  Past Surgical History:  She  has a past surgical history that includes Uterine fibroid surgery; Partial hysterectomy (N/A, 03/19/2010); Colonoscopy with propofol  (N/A, 07/11/2022); Endoscopic mucosal resection (N/A, 07/11/2022); Hemostasis clip placement (07/11/2022); polypectomy (07/11/2022); Submucosal lifting injection (07/11/2022); Colonoscopy with propofol  (N/A, 06/03/2023); and polypectomy (06/03/2023).  Brief Summary:  Michaela Alvarado is a 68 y.o. female with obstructive sleep apnea.      Subjective:   She is sleeping better since she started CPAP.  Only issue is that air blows into her right eye from CPAP mask.  Seems to be from exhalation port.  Pressure setting okay otherwise.  She has been struggling to get her weight down.  Physical Exam:   Appearance - well kempt   ENMT - no sinus tenderness, no oral exudate, no LAN, Mallampati 4 airway, no stridor  Respiratory - equal breath sounds bilaterally, no wheezing or rales  CV - s1s2 regular rate and rhythm, no murmurs  Ext - no clubbing, no edema  Skin - no rashes  Psych - normal mood and affect    Sleep Tests:  HST 08/01/20 >> AHI 31.9, SpO2 low 80% Auto CPAP 01/20/21 to 04/19/21 >> used 5 hrs 1 min per night one average.  Average AHI 0.8 with  median CPAP 7 and 95 th percentile CPAP 9 cm H2O  Social History:  She  reports that she quit smoking about 28 years ago. Her smoking use included cigarettes. She started smoking about 39 years ago. She has a 2.8 pack-year smoking history. She has never used smokeless tobacco. She reports that she does not drink alcohol and does not use drugs.  Family History:  Her family history includes Diabetes in her mother; Hypertension in her mother; Leukemia in her father.    Data card download reports from September 08/2023 till October 07/2023 Uses days 10/30, 33% More than 4 hours use 3% Less than 4 hours use 30% Average use on days used 1 hour 14 minutes APAP 5-15 cm water 95th percentile pressure 6.6 95th percentile leak 8.2, maximum leak 43.6 Residual AHI 0.9  Chest xray 07/2023: within normal limits    Assessment/Plan:   Obstructive sleep apnea. - she is compliant with CPAP when she is home, using CPAP since 2022 - uses [facility] for her DME - reviewed data card download as above- reduced compliance as pt has been visiting her mother in [place] frequently and doesn't have her machine with her when she travels.  Advised patient to take her machine with her when she travels.  She will start doing that now. -Patient reports benefit since starting CPAP machine, has lower hypersomnolence and more energy during the day. Pt is  overall benefiting from the machine and needs to continue to use it. Strongly encouraged compliance and she is committed to use it more often, including when she travels out of [place] - continue auto CPAP 5 to 15 cm H2O - will arrange for CPAP supplies  Obesity. - encouraged her to keep up with her weight loss efforts. Pt is prediabetic  Flu shot given today  Follow up:   Patient Instructions  It was a pleasure to see you today. We will send your supplies to [facility] Please take your CPAP machine with you when you travel   Medication List:   Allergies as of  12/24/2023       Reactions   Metronidazole Hives   Other Reaction(s): Not available   Peach [prunus Persica] Hives   Fruit with fine hair   Augmentin  [amoxicillin -pot Clavulanate] Diarrhea        Medication List        Accurate as of December 24, 2023 12:06 PM. If you have any questions, ask your nurse or doctor.          fluconazole  150 MG tablet Commonly known as: Diflucan  Take 1 tablet (150 mg total) by mouth every 3 (three) days as needed for up to 2 doses.   fluticasone  50 MCG/ACT nasal spray Commonly known as: FLONASE  Place 1 spray into both nostrils 2 (two) times daily as needed for allergies or rhinitis.   HAIR SKIN & NAILS PO Take 2 tablets by mouth daily.   MAGNESIUM 27 PO Magnesium   meclizine  25 MG tablet Commonly known as: ANTIVERT  Take 1 tablet (25 mg total) by mouth 3 (three) times daily as needed for dizziness.   naproxen  500 MG tablet Commonly known as: NAPROSYN  Take 1 tablet (500 mg total) by mouth 2 (two) times daily as needed for moderate pain or mild pain.   nortriptyline  10 MG capsule Commonly known as: PAMELOR  Take 1 capsule (10 mg total) by mouth at bedtime.   ondansetron  4 MG disintegrating tablet Commonly known as: ZOFRAN -ODT Take 1 tablet (4 mg total) by mouth every 8 (eight) hours as needed for nausea or vomiting.   QC TUMERIC COMPLEX PO Take 2,000 mg by mouth daily. 1000 mg each   Systane 0.4-0.3 % Soln Generic drug: Polyethyl Glycol-Propyl Glycol Place 1 drop into both eyes daily as needed (Dry eyes).        Signature:  Seraya Jobst Pleas, MD Pulmonary and Critical Care [facility] [place]

## 2024-01-08 ENCOUNTER — Ambulatory Visit
Admission: RE | Admit: 2024-01-08 | Discharge: 2024-01-08 | Disposition: A | Source: Ambulatory Visit | Attending: Family Medicine | Admitting: Family Medicine

## 2024-01-08 DIAGNOSIS — Z1231 Encounter for screening mammogram for malignant neoplasm of breast: Secondary | ICD-10-CM

## 2024-01-15 ENCOUNTER — Telehealth: Payer: Self-pay

## 2024-01-15 NOTE — Telephone Encounter (Signed)
 Copied from CRM [id number]. Topic: Clinical - Order For Equipment >> Jan 14, 2024  5:08 PM Rozanna MATSU wrote: Reason for CRM: Dagoberto with [facility] [phone] fax#[phone] stated needed order for pt CPAP machine. Stated rec'd fax from provider Pleas but it was not medicare compliant and they need a new prescription with the quantity and frequency for the supplies.

## 2024-01-15 NOTE — Telephone Encounter (Signed)
 Called judy from [employer] regarding message below. She said she's going to send a prescription request form for Dr Pleas to sign. Awaiting form.

## 2024-01-22 NOTE — Telephone Encounter (Signed)
 Form received and faxed to [facility]. Fax was successful; form placed in scan folder. NFN
